# Patient Record
Sex: Female | Born: 1985 | Race: White | Hispanic: No | Marital: Married | Smoking: Never smoker
Health system: Southern US, Community
[De-identification: ages and names within clinical notes are randomized; demographics above are authoritative.]

## PROBLEM LIST (undated history)

## (undated) ENCOUNTER — Inpatient Hospital Stay (HOSPITAL_COMMUNITY): Payer: Self-pay

## (undated) DIAGNOSIS — E079 Disorder of thyroid, unspecified: Secondary | ICD-10-CM

## (undated) DIAGNOSIS — M329 Systemic lupus erythematosus, unspecified: Secondary | ICD-10-CM

## (undated) DIAGNOSIS — IMO0002 Reserved for concepts with insufficient information to code with codable children: Secondary | ICD-10-CM

## (undated) DIAGNOSIS — E039 Hypothyroidism, unspecified: Secondary | ICD-10-CM

## (undated) DIAGNOSIS — E559 Vitamin D deficiency, unspecified: Secondary | ICD-10-CM

## (undated) HISTORY — DX: Reserved for concepts with insufficient information to code with codable children: IMO0002

## (undated) HISTORY — PX: WISDOM TOOTH EXTRACTION: SHX21

## (undated) HISTORY — DX: Vitamin D deficiency, unspecified: E55.9

## (undated) HISTORY — PX: NO PAST SURGERIES: SHX2092

## (undated) HISTORY — DX: Disorder of thyroid, unspecified: E07.9

## (undated) HISTORY — DX: Systemic lupus erythematosus, unspecified: M32.9

---

## 2012-02-01 LAB — HEPATIC FUNCTION PANEL
ALT: 14 U/L (ref 7–35)
ALT: 14 U/L (ref 7–35)
AST: 21 U/L (ref 13–35)
Alkaline Phosphatase: 63 U/L (ref 25–125)
Alkaline Phosphatase: 63 U/L (ref 25–125)
Bilirubin, Total: 0.6 mg/dL

## 2012-02-01 LAB — CBC AND DIFFERENTIAL
Hemoglobin: 13.8 g/dL (ref 12.0–16.0)
WBC: 5.5 10^3/mL

## 2012-02-01 LAB — BASIC METABOLIC PANEL
Glucose: 82 mg/dL
Potassium: 4 mmol/L (ref 3.4–5.3)
Sodium: 136 mmol/L — AB (ref 137–147)

## 2012-02-01 LAB — POCT ERYTHROCYTE SEDIMENTATION RATE, NON-AUTOMATED

## 2012-07-21 LAB — HM PAP SMEAR: HM Pap smear: NEGATIVE

## 2013-07-24 ENCOUNTER — Ambulatory Visit: Payer: Self-pay | Admitting: Family Medicine

## 2013-07-24 ENCOUNTER — Ambulatory Visit (INDEPENDENT_AMBULATORY_CARE_PROVIDER_SITE_OTHER): Payer: 59 | Admitting: Physician Assistant

## 2013-07-24 ENCOUNTER — Encounter: Payer: Self-pay | Admitting: Physician Assistant

## 2013-07-24 VITALS — BP 136/65 | HR 88 | Ht 66.0 in | Wt 163.0 lb

## 2013-07-24 DIAGNOSIS — Z3009 Encounter for other general counseling and advice on contraception: Secondary | ICD-10-CM

## 2013-07-24 DIAGNOSIS — J069 Acute upper respiratory infection, unspecified: Secondary | ICD-10-CM

## 2013-07-24 DIAGNOSIS — M329 Systemic lupus erythematosus, unspecified: Secondary | ICD-10-CM | POA: Insufficient documentation

## 2013-07-24 DIAGNOSIS — Z131 Encounter for screening for diabetes mellitus: Secondary | ICD-10-CM

## 2013-07-24 DIAGNOSIS — Z13220 Encounter for screening for lipoid disorders: Secondary | ICD-10-CM

## 2013-07-24 DIAGNOSIS — E039 Hypothyroidism, unspecified: Secondary | ICD-10-CM

## 2013-07-24 MED ORDER — FLUTICASONE PROPIONATE 50 MCG/ACT NA SUSP: 2.0000 | Freq: Every day | NASAL | Status: DC

## 2013-07-24 NOTE — Progress Notes (Signed)
Subjective:    Patient ID: Melissa Burch, female    DOB: October 13, 1985, 27 y.o.   MRN: [id number]  HPI The patient is a 27 year old female who presents to the clinic to establish care today. Patient has recently moved here from [place] in her husband's job was relocated. She is in need of some referrals in the area as well as management of her hypothyroidism. Patient does have a past medical history for lupus and hypothyroidism. Melissa Burch has been fairly well managed with Plaquenil. Patient currently takes levothyroxine 125 MCG tablets daily for hypothyroidism. Hypothyroidism was diagnosed about a year ago. She does so much better today. She continues to have flares of lupus but they seemed to be managed with pain active in the medication.  Her last Pap smear was 9/13. She would like to be referred to OB/GYN to talk about family planning and 2 get her yearly Pap smear.  She does not smoke and drinks alcohol socially around 2 drinks a week. Patient does stay active with a leg, walking, hiking.  Patient has not had screening labs done in a while. I also do not see an up-to-date tetanus shot.  Family history is positive for heart attack, diabetes, high cholesterol, rheumatoid arthritis, and stroke.   Patient does come in today feeling like she has some swollen glands in her neck. Glands have been swollen for the last 3 days. She denies any sinus pressure, ear pain, cough, sore throat, fever, chills, nausea or vomiting. She does feel like a little thicker to swallow. She has no sick contacts. She's tried nothing to make better and nothing seems to be making worse. She feels her symptoms are stable.   Review of Systems  All other systems reviewed and are negative.       Objective:   Physical Exam  Constitutional: She is oriented to person, place, and time. She appears well-developed and well-nourished.  HENT:  Head: Normocephalic and atraumatic.  Right Ear: External ear normal.  Left Ear:  External ear normal.  Nose: Nose normal.  Mouth/Throat: No oropharyngeal exudate.  TMs clear bilaterally.  Oropharynx erythematous with PND present in back of throat. Tonsils not enlarged and no exudate.   Negative for any maxillary or frontal sinus tenderness.   Eyes: Conjunctivae are normal. Right eye exhibits no discharge. Left eye exhibits no discharge.  Neck: Normal range of motion.  Anterior lymph nodes are swollen but not tender to touch.  Cardiovascular: Normal rate, regular rhythm and normal heart sounds.   Pulmonary/Chest: Effort normal and breath sounds normal. She has no wheezes.  Neurological: She is alert and oriented to person, place, and time.  Skin: Skin is warm and dry.  Psychiatric: She has a normal mood and affect. Her behavior is normal.          Assessment & Plan:    Screening labs were ordered for this patient.  Patient declined flu shot today. Risk and benefits of flu shot were discussed with patient. Patient was also suggested to get a tetanus shot. Patient declines at this time.  LUpus- referred patient how to rheumatologist. [facility] were recommended for eye exam to manage plaquenil and eye changes.   Family planning/need for pap- will refer to OB. This would be an appropriate place to start in wanting to become pregnant in the near future especially with concomitant lupus.  Hypothyroidism- we will recheck TSH today. We'll make adjustments if necessary.  URI- I do not see any infectious causes for  swollen lymph nodes. Did get nasal spray to help with postnasal drip. Gave handout with upper respiratory symptomatic care. Encouraged patient to continue with symptomatic care. Call if steadily worsen or if not improving. Discussed with patient and she had recent flare of lupus could be some inflammatory response.

## 2013-07-24 NOTE — Patient Instructions (Addendum)
[facility] in [place].   Will call with specialist appt.   Upper Respiratory Infection, Adult An upper respiratory infection (URI) is also known as the common cold. It is often caused by a type of germ (virus). Colds are easily spread (contagious). You can pass it to others by kissing, coughing, sneezing, or drinking out of the same glass. Usually, you get better in 1 or 2 weeks.  HOME CARE   Only take medicine as told by your doctor.  Use a warm mist humidifier or breathe in steam from a hot shower.  Drink enough water and fluids to keep your pee (urine) clear or pale yellow.  Get plenty of rest.  Return to work when your temperature is back to normal or as told by your doctor. You may use a face mask and wash your hands to stop your cold from spreading. GET HELP RIGHT AWAY IF:   After the first few days, you feel you are getting worse.  You have questions about your medicine.  You have chills, shortness of breath, or brown or red spit (mucus).  You have yellow or brown snot (nasal discharge) or pain in the face, especially when you bend forward.  You have a fever, puffy (swollen) neck, pain when you swallow, or white spots in the back of your throat.  You have a bad headache, ear pain, sinus pain, or chest pain.  You have a high-pitched whistling sound when you breathe in and out (wheezing).  You have a lasting cough or cough up blood.  You have sore muscles or a stiff neck. MAKE SURE YOU:   Understand these instructions.  Will watch your condition.  Will get help right away if you are not doing well or get worse. Document Released: 02/24/2008 Document Revised: 11/30/2011 Document Reviewed: 01/12/2011 [facility] Patient Information 2014 [place], [place].

## 2013-07-25 ENCOUNTER — Encounter: Payer: Self-pay | Admitting: Physician Assistant

## 2013-07-25 DIAGNOSIS — K589 Irritable bowel syndrome without diarrhea: Secondary | ICD-10-CM | POA: Insufficient documentation

## 2013-07-25 DIAGNOSIS — K219 Gastro-esophageal reflux disease without esophagitis: Secondary | ICD-10-CM | POA: Insufficient documentation

## 2013-07-25 DIAGNOSIS — E7800 Pure hypercholesterolemia, unspecified: Secondary | ICD-10-CM | POA: Insufficient documentation

## 2013-07-27 ENCOUNTER — Other Ambulatory Visit: Payer: Self-pay | Admitting: Physician Assistant

## 2013-07-27 LAB — COMPLETE METABOLIC PANEL WITH GFR
ALT: 15 U/L (ref 0–35)
AST: 20 U/L (ref 0–37)
Alkaline Phosphatase: 70 U/L (ref 39–117)
BUN: 12 mg/dL (ref 6–23)
Calcium: 9.3 mg/dL (ref 8.4–10.5)
Chloride: 102 mEq/L (ref 96–112)
Creat: 0.82 mg/dL (ref 0.50–1.10)
Total Bilirubin: 0.8 mg/dL (ref 0.3–1.2)

## 2013-07-27 LAB — LIPID PANEL
Cholesterol: 153 mg/dL (ref 0–200)
HDL: 70 mg/dL (ref >39)
LDL Cholesterol: 69 mg/dL (ref 0–99)
Total CHOL/HDL Ratio: 2.2 Ratio
Triglycerides: 69 mg/dL (ref <150)
VLDL: 14 mg/dL (ref 0–40)

## 2013-07-27 LAB — T4, FREE: Free T4: 1.36 ng/dL (ref 0.80–1.80)

## 2013-08-14 ENCOUNTER — Encounter: Payer: 59 | Admitting: Advanced Practice Midwife

## 2013-08-14 DIAGNOSIS — Z124 Encounter for screening for malignant neoplasm of cervix: Secondary | ICD-10-CM

## 2013-08-14 DIAGNOSIS — Z01419 Encounter for gynecological examination (general) (routine) without abnormal findings: Secondary | ICD-10-CM

## 2013-08-28 ENCOUNTER — Encounter: Payer: Self-pay | Admitting: Physician Assistant

## 2013-08-28 ENCOUNTER — Other Ambulatory Visit: Payer: Self-pay | Admitting: Physician Assistant

## 2013-08-28 MED ORDER — LEVOTHYROXINE SODIUM 125 MCG PO TABS: 125.0000 ug | ORAL_TABLET | Freq: Every day | ORAL | Status: DC

## 2013-09-15 ENCOUNTER — Encounter: Payer: Self-pay | Admitting: *Deleted

## 2013-10-16 ENCOUNTER — Encounter: Payer: Self-pay | Admitting: Advanced Practice Midwife

## 2013-10-16 ENCOUNTER — Ambulatory Visit (INDEPENDENT_AMBULATORY_CARE_PROVIDER_SITE_OTHER): Payer: 59 | Admitting: Advanced Practice Midwife

## 2013-10-16 VITALS — BP 105/63 | HR 84 | Resp 16 | Ht 66.0 in | Wt 166.0 lb

## 2013-10-16 DIAGNOSIS — Z3009 Encounter for other general counseling and advice on contraception: Secondary | ICD-10-CM

## 2013-10-16 DIAGNOSIS — Z124 Encounter for screening for malignant neoplasm of cervix: Secondary | ICD-10-CM

## 2013-10-16 DIAGNOSIS — Z01419 Encounter for gynecological examination (general) (routine) without abnormal findings: Secondary | ICD-10-CM

## 2013-10-16 DIAGNOSIS — E7800 Pure hypercholesterolemia, unspecified: Secondary | ICD-10-CM

## 2013-10-16 MED ORDER — NORETHINDRON-ETHINYL ESTRAD-FE 1-20/1-30/1-35 MG-MCG PO TABS: 1.0000 | ORAL_TABLET | Freq: Every day | ORAL | Status: DC

## 2013-10-16 NOTE — Progress Notes (Signed)
  Subjective:     Melissa HatchetMaggie Burch is a 28 y.o. female here for a routine exam.  Current complaints: None.  May want to get pregnant this year, but wants Rx for OCPs anyway. States PMS more intense over past few months.  Personal health questionnaire reviewed: not asked.   Gynecologic History Patient's last menstrual period was 10/17/2012. Contraception: OCP (estrogen/progesterone) Last Pap: 2 yrs. Results were: normal Last mammogram: never.   Obstetric History OB History  Gravida Para Term Preterm AB SAB TAB Ectopic Multiple Living  0 0 0 0 0 0 0 0 0 0          The following portions of the patient's history were reviewed and updated as appropriate: allergies, current medications, past family history, past medical history, past social history, past surgical history and problem list.  Review of Systems Pertinent items are noted in HPI.    Objective:    BP 105/63  Pulse 84  Resp 16  Ht 5\' 6"  (1.676 m)  Wt 75.297 kg (166 lb)  BMI 26.81 kg/m2  LMP 10/17/2012 General appearance: alert, cooperative and no distress Neck: no adenopathy, no carotid bruit, no JVD, supple, symmetrical, trachea midline and thyroid not enlarged, symmetric, no tenderness/mass/nodules Back: symmetric, no curvature. ROM normal. No CVA tenderness. Lungs: clear to auscultation bilaterally Breasts: normal appearance, no masses or tenderness, No nipple retraction or dimpling, No nipple discharge or bleeding, No axillary or supraclavicular adenopathy Heart: regular rate and rhythm, S1, S2 normal, no murmur, click, rub or gallop Abdomen: soft, non-tender; bowel sounds normal; no masses,  no organomegaly Pelvic: cervix normal in appearance, external genitalia normal, no adnexal masses or tenderness, no cervical motion tenderness, rectovaginal septum normal, uterus normal size, shape, and consistency and vagina normal without discharge    Assessment:    Healthy female exam.   Lupus  PMS May want to conceive  soon  Plan:    Education reviewed: self breast exams and Start prenatal vitamins at least 2 mos prior to conception. Contraception: OCP (estrogen/progesterone). Follow up in: 1 year.   Also discussed impact of Lupus on pregnancy, recommendations of what we would do and how it would affect her care.  Encouraged early [facility]

## 2013-10-16 NOTE — Patient Instructions (Signed)

## 2013-10-23 ENCOUNTER — Encounter: Payer: Self-pay | Admitting: *Deleted

## 2014-03-27 ENCOUNTER — Other Ambulatory Visit: Payer: Self-pay | Admitting: Physician Assistant

## 2014-04-18 ENCOUNTER — Ambulatory Visit (INDEPENDENT_AMBULATORY_CARE_PROVIDER_SITE_OTHER): Payer: 59 | Admitting: Family Medicine

## 2014-04-18 ENCOUNTER — Encounter: Payer: Self-pay | Admitting: Family Medicine

## 2014-04-18 VITALS — BP 113/76 | HR 78 | Temp 98.1°F | Wt 159.0 lb

## 2014-04-18 DIAGNOSIS — R058 Other specified cough: Secondary | ICD-10-CM

## 2014-04-18 DIAGNOSIS — R05 Cough: Secondary | ICD-10-CM

## 2014-04-18 DIAGNOSIS — R059 Cough, unspecified: Secondary | ICD-10-CM

## 2014-04-18 MED ORDER — HYDROCODONE-HOMATROPINE 5-1.5 MG/5ML PO SYRP: 5.0000 mL | ORAL_SOLUTION | Freq: Three times a day (TID) | ORAL | Status: DC | PRN

## 2014-04-18 MED ORDER — PREDNISONE 20 MG PO TABS: ORAL_TABLET | ORAL | Status: AC

## 2014-04-18 NOTE — Progress Notes (Signed)
CC: Melissa Burch is a 28 y.o. female is here for cough since sunday   Subjective: HPI:  Complains of nonproductive cough that has been present since Sunday morning that slowly became severe in severity peaking on Tuesday. Last night symptoms were bad enough to where she did not get any sleep at all is having constant coughing with a burning sensation in her chest. Symptoms not improved with Aleve nor over-the-counter cough syrups. Symptoms have slightly improved since resting this morning. Denies wheezing, shortness of breath, exertional chest pain, blood in sputum, productive cough, confusion, nasal congestion, sinus pressure. Review of systems is positive for myalgias that were present at the onset of the illness but now resolved, positive for fatigue.   Review Of Systems Outlined In HPI  Past Medical History  Diagnosis Date  . Lupus   . Thyroid disease     No past surgical history on file. Family History  Problem Relation Age of Onset  . Hyperlipidemia Mother   . Diabetes Father   . Heart attack Paternal Grandmother   . Stroke Paternal Grandmother   . Diabetes Paternal Grandfather     History   Social History  . Marital Status: Married    Spouse Name: N/A    Number of Children: N/A  . Years of Education: N/A   Occupational History  . Nanny    Social History Main Topics  . Smoking status: Never Smoker   . Smokeless tobacco: Never Used  . Alcohol Use: Yes  . Drug Use: No  . Sexual Activity: Yes    Partners: Male    Birth Control/ Protection: Pill   Other Topics Concern  . Not on file   Social History Narrative  . No narrative on file     Objective: BP 113/76  Pulse 78  Temp(Src) 98.1 F (36.7 C) (Oral)  Wt 159 lb (72.122 kg)  SpO2 100%  General: Alert and Oriented, No Acute Distress HEENT: Pupils equal, round, reactive to light. Conjunctivae clear.  External ears unremarkable, canals clear with intact TMs with appropriate landmarks.  Middle ear appears  open without effusion. Pink inferior turbinates.  Moist mucous membranes, pharynx without inflammation nor lesions.  Neck supple without palpable lymphadenopathy nor abnormal masses. Lungs: Clear to auscultation bilaterally, no wheezing/ronchi/rales.  Comfortable work of breathing. Good air movement. Extremities: No peripheral edema.  Strong peripheral pulses.  Mental Status: No depression, anxiety, nor agitation. Skin: Warm and dry.  Assessment & Plan: Seward GraterMaggie was seen today for cough since sunday.  Diagnoses and associated orders for this visit:  Post-viral cough syndrome - HYDROcodone-homatropine (HYCODAN) 5-1.5 MG/5ML syrup; Take 5 mLs by mouth every 8 (eight) hours as needed for cough. - predniSONE (DELTASONE) 20 MG tablet; Three tabs daily days 1-3, two tabs daily days 4-6, one tab daily days 7-9, half tab daily days 10-13.    Cough: Discussed this is most likely viral in etiology symptomatically control with starting Hycodan as needed, offered prednisone taper to help with chest discomfort and speed up resolution however she politely declines and would like to see how well the Hycodan works first.Signs and symptoms requring emergent/urgent reevaluation were discussed with the patient.   No Follow-up on file.

## 2014-04-23 ENCOUNTER — Other Ambulatory Visit: Payer: Self-pay | Admitting: Physician Assistant

## 2014-05-14 ENCOUNTER — Ambulatory Visit (INDEPENDENT_AMBULATORY_CARE_PROVIDER_SITE_OTHER): Payer: 59 | Admitting: Physician Assistant

## 2014-05-14 ENCOUNTER — Encounter: Payer: Self-pay | Admitting: Physician Assistant

## 2014-05-14 VITALS — BP 119/65 | HR 81 | Ht 66.0 in | Wt 176.0 lb

## 2014-05-14 DIAGNOSIS — E039 Hypothyroidism, unspecified: Secondary | ICD-10-CM

## 2014-05-14 MED ORDER — LEVOTHYROXINE SODIUM 125 MCG PO TABS: ORAL_TABLET | ORAL | Status: DC

## 2014-05-14 NOTE — Progress Notes (Signed)
   Subjective:    Patient ID: Melissa Burch, female    DOB: 02-25-1986, 28 y.o.   MRN: [id number]  HPI Pt presents to the clinic for 6 month medication follow up.   Hypothyroidism- doing well on current dose. She denies any mood changes or loss of energy.She has gained 17 lbs in one month. Pt feels like it is prednisone and stress combination. She lost her job unexpectantly last month. She is not exercising.    Review of Systems  All other systems reviewed and are negative.      Objective:   Physical Exam  Constitutional: She is oriented to person, place, and time. She appears well-developed and well-nourished.  HENT:  Head: Normocephalic and atraumatic.  Neck: Normal range of motion. Neck supple. No thyromegaly present.  Cardiovascular: Normal rate, regular rhythm and normal heart sounds.   Pulmonary/Chest: Effort normal and breath sounds normal.  Neurological: She is alert and oriented to person, place, and time.  Skin: Skin is dry.  Psychiatric: She has a normal mood and affect. Her behavior is normal.          Assessment & Plan:  Hypothyroidism- will check labs today. Refilled levothyroxine for 6 months. Discussed weight gain and importance of regular exercise.

## 2014-05-15 ENCOUNTER — Encounter: Payer: Self-pay | Admitting: Physician Assistant

## 2014-05-15 LAB — TSH: TSH: 2.003 u[IU]/mL (ref 0.350–4.500)

## 2014-05-15 LAB — T4, FREE: FREE T4: 1.44 ng/dL (ref 0.80–1.80)

## 2014-05-21 ENCOUNTER — Encounter: Payer: Self-pay | Admitting: Advanced Practice Midwife

## 2014-09-21 NOTE — L&D Delivery Note (Signed)
Operative Delivery Note    I was called to room at 0539 for 3 contractions with deep deceleration to 70s after with good recovery The station at that point was +2 The decelerations were relatively infrequent at this point with excellent variability I stayed in the room as she continued pushing.  Her effort was good but the uterus was lacking power I started pitocin to improve uterine power At about 0625 the decelerations returned with sluggish return and then no complete return. The station was now +4-5 I discussed placement of the vacuum with the patient due to this and she agreed i placed the vacuum at 0634 It was used for 3 contractions A MLE was cut  At 6:40 AM a viable female was delivered via .  Presentation: vertex; Position: Occiput,, Anterior; Station: +5.  Verbal consent: obtained from patient.  Risks and benefits discussed in detail.  Risks include, but are not limited to the risks of anesthesia, bleeding, infection, damage to maternal tissues, fetal cephalhematoma.  There is also the risk of inability to effect vaginal delivery of the head, or shoulder dystocia that cannot be resolved by established maneuvers, leading to the need for emergency cesarean section.  APGAR: 6, 9; weight  .   Placenta status: delivered intact, .   Cord: 3 vessel  with the following complications: thin cord.  Cord pH: 7.06  Anesthesia: Epidural  Instruments: Bell style vacuum Episiotomy: Median Lacerations: 3rd degree Suture Repair: 3.0 partial,capsule was breached but the muscle itself was intact Est. Blood Loss (mL):  250  Mom to postpartum.  Baby to Couplet care / Skin to Skin.  Lazaro ArmsEURE,Kemontae Dunklee H, MD  09/19/2015, 7:08 AM

## 2014-12-12 ENCOUNTER — Other Ambulatory Visit: Payer: Self-pay | Admitting: Physician Assistant

## 2014-12-12 ENCOUNTER — Ambulatory Visit (INDEPENDENT_AMBULATORY_CARE_PROVIDER_SITE_OTHER): Payer: 59 | Admitting: Obstetrics & Gynecology

## 2014-12-12 ENCOUNTER — Encounter: Payer: Self-pay | Admitting: Obstetrics & Gynecology

## 2014-12-12 VITALS — BP 116/63 | HR 92 | Resp 16 | Ht 66.0 in | Wt 180.0 lb

## 2014-12-12 DIAGNOSIS — Z124 Encounter for screening for malignant neoplasm of cervix: Secondary | ICD-10-CM

## 2014-12-12 DIAGNOSIS — Z01419 Encounter for gynecological examination (general) (routine) without abnormal findings: Secondary | ICD-10-CM

## 2014-12-12 DIAGNOSIS — Z0000 Encounter for general adult medical examination without abnormal findings: Secondary | ICD-10-CM

## 2014-12-12 NOTE — Progress Notes (Signed)
Subjective:    Melissa Burch is a 29 y.o. MW P0  female who presents for an annual exam. The patient has no complaints today. She is having unprotected IC for the last 5 months, would like a pregnancy.The patient is sexually active. GYN screening history: last pap: was normal. The patient wears seatbelts: yes. The patient participates in regular exercise: yes. (ballet) Has the patient ever been transfused or tattooed?: yes. The patient reports that there is not domestic violence in her life.   Menstrual History: OB History    Gravida Para Term Preterm AB TAB SAB Ectopic Multiple Living   0 0 0 0 0 0 0 0 0 0       Menarche age: [age]  Patient's last menstrual period was 11/22/2014.    The following portions of the patient's history were reviewed and updated as appropriate: allergies, current medications, past family history, past medical history, past social history, past surgical history and problem list.  Review of Systems Pertinent items are noted in HPI. Married for 7 years, denies dyspareunia. 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 Declines a flu vaccine.   Objective:    BP 116/63 mmHg  Pulse 92  Resp 16  Ht 5\' 6"  (1.676 m)  Wt 180 lb (81.647 kg)  BMI 29.07 kg/m2  LMP 11/22/2014  General Appearance:    Alert, cooperative, no distress, appears stated age  Head:    Normocephalic, without obvious abnormality, atraumatic  Eyes:    PERRL, conjunctiva/corneas clear, EOM's intact, fundi    benign, both eyes  Ears:    Normal TM's and external ear canals, both ears  Nose:   Nares normal, septum midline, mucosa normal, no drainage    or sinus tenderness  Throat:   Lips, mucosa, and tongue normal; teeth and gums normal  Neck:   Supple, symmetrical, trachea midline, no adenopathy;    thyroid:  no enlargement/tenderness/nodules; no carotid   bruit or JVD  Back:     Symmetric, no curvature, ROM normal, no CVA tenderness  Lungs:     Clear to auscultation bilaterally, respirations unlabored  Chest  Wall:    No tenderness or deformity   Heart:    Regular rate and rhythm, S1 and S2 normal, no murmur, rub   or gallop  Breast Exam:    No tenderness, masses, or nipple abnormality  Abdomen:     Soft, non-tender, bowel sounds active all four quadrants,    no masses, no organomegaly  Genitalia:    Normal female without lesion, discharge or tenderness, shaved, NSSA, NT, mobile, normal adnexal exam     Extremities:   Extremities normal, atraumatic, no cyanosis or edema  Pulses:   2+ and symmetric all extremities  Skin:   Skin color, texture, turgor normal, no rashes or lesions  Lymph nodes:   Cervical, supraclavicular, and axillary nodes normal  Neurologic:   CNII-XII intact, normal strength, sensation and reflexes    throughout  .    Assessment:    Healthy female exam.    Plan:     Breast self exam technique reviewed and patient encouraged to perform self-exam monthly. Thin prep Pap smear.   Rec MVI daily

## 2014-12-13 LAB — CYTOLOGY - PAP

## 2015-02-04 ENCOUNTER — Encounter (HOSPITAL_COMMUNITY): Payer: Self-pay

## 2015-02-04 ENCOUNTER — Inpatient Hospital Stay (HOSPITAL_COMMUNITY)
Admission: AD | Admit: 2015-02-04 | Discharge: 2015-02-04 | Disposition: A | Discharge status: Home / Self Care | Payer: 59 | Source: Ambulatory Visit | Attending: Family Medicine | Admitting: Family Medicine

## 2015-02-04 ENCOUNTER — Inpatient Hospital Stay (HOSPITAL_COMMUNITY): Payer: 59

## 2015-02-04 DIAGNOSIS — O208 Other hemorrhage in early pregnancy: Secondary | ICD-10-CM | POA: Diagnosis not present

## 2015-02-04 DIAGNOSIS — O209 Hemorrhage in early pregnancy, unspecified: Secondary | ICD-10-CM

## 2015-02-04 DIAGNOSIS — O4691 Antepartum hemorrhage, unspecified, first trimester: Secondary | ICD-10-CM | POA: Diagnosis not present

## 2015-02-04 DIAGNOSIS — Z3A01 Less than 8 weeks gestation of pregnancy: Secondary | ICD-10-CM | POA: Diagnosis not present

## 2015-02-04 LAB — CBC WITH DIFFERENTIAL/PLATELET
Basophils Absolute: 0 K/uL (ref 0.0–0.1)
Basophils Relative: 0 % (ref 0–1)
Eosinophils Absolute: 0.1 K/uL (ref 0.0–0.7)
Eosinophils Relative: 1 % (ref 0–5)
HCT: 34.4 % — ABNORMAL LOW (ref 36.0–46.0)
Hemoglobin: 11.8 g/dL — ABNORMAL LOW (ref 12.0–15.0)
Lymphocytes Relative: 23 % (ref 12–46)
Lymphs Abs: 2 K/uL (ref 0.7–4.0)
MCH: 30.9 pg (ref 26.0–34.0)
MCHC: 34.3 g/dL (ref 30.0–36.0)
MCV: 90.1 fL (ref 78.0–100.0)
Monocytes Absolute: 0.6 K/uL (ref 0.1–1.0)
Monocytes Relative: 7 % (ref 3–12)
Neutro Abs: 6 K/uL (ref 1.7–7.7)
Neutrophils Relative %: 69 % (ref 43–77)
Platelets: 256 K/uL (ref 150–400)
RBC: 3.82 MIL/uL — ABNORMAL LOW (ref 3.87–5.11)
RDW: 12.3 % (ref 11.5–15.5)
WBC: 8.8 K/uL (ref 4.0–10.5)

## 2015-02-04 LAB — URINE MICROSCOPIC-ADD ON

## 2015-02-04 LAB — URINALYSIS, ROUTINE W REFLEX MICROSCOPIC
Bilirubin Urine: NEGATIVE
Glucose, UA: NEGATIVE mg/dL
Ketones, ur: 15 mg/dL — AB
Leukocytes, UA: NEGATIVE
Nitrite: NEGATIVE
PH: 6 (ref 5.0–8.0)
Protein, ur: NEGATIVE mg/dL
UROBILINOGEN UA: 0.2 mg/dL (ref 0.0–1.0)

## 2015-02-04 LAB — POCT PREGNANCY, URINE: PREG TEST UR: POSITIVE — AB

## 2015-02-04 NOTE — Discharge Instructions (Signed)

## 2015-02-04 NOTE — MAU Note (Signed)
Abdominal cramping & vaginal bleeding since this afternoon. Red blood dripping in toilet when she went to the bathroom this evening.

## 2015-02-04 NOTE — MAU Provider Note (Signed)
  History    G1 @7 .2 wks in with vag bleeding that started at 1800 tonight. Mild abd cramping. CSN: [id number]  Arrival date and time: 02/04/15 1929   None     Chief Complaint  Patient presents with  . Possible Pregnancy  . Abdominal Cramping  . Vaginal Bleeding   HPI  OB History    Gravida Para Term Preterm AB TAB SAB Ectopic Multiple Living   1 0 0 0 0 0 0 0 0 0       Past Medical History  Diagnosis Date  . Lupus   . Thyroid disease   . Vitamin D deficiency     No past surgical history on file.  Family History  Problem Relation Age of Onset  . Hyperlipidemia Mother   . Diabetes Father   . Heart attack Paternal Grandmother   . Stroke Paternal Grandmother   . Diabetes Paternal Grandfather     History  Substance Use Topics  . Smoking status: Never Smoker   . Smokeless tobacco: Never Used  . Alcohol Use: Yes    Allergies: No Known Allergies  Prescriptions prior to admission  Medication Sig Dispense Refill Last Dose  . cholecalciferol (VITAMIN D) 1000 UNITS tablet Take 1,000 Units by mouth daily.   02/04/2015 at Unknown time  . Ginger 500 MG CAPS Take 1 capsule by mouth as needed (nausea).   Past Week at Unknown time  . hydroxychloroquine (PLAQUENIL) 200 MG tablet Take 200 mg by mouth 2 (two) times daily.   02/04/2015 at Unknown time  . levothyroxine (SYNTHROID, LEVOTHROID) 125 MCG tablet take 1 tablet by mouth once daily 30 tablet 2 02/04/2015 at Unknown time  . Prenatal Vit-Fe Fumarate-FA (PRENATAL MULTIVITAMIN) TABS tablet Take 1 tablet by mouth daily at 12 noon.   02/03/2015 at Unknown time  . Probiotic Product (PROBIOTIC DAILY PO) Take 1 capsule by mouth daily.    02/04/2015 at Unknown time    Review of Systems  Constitutional: Negative.   HENT: Negative.   Respiratory: Negative.   Cardiovascular: Negative.   Gastrointestinal: Positive for abdominal pain.  Genitourinary: Negative.   Musculoskeletal: Negative.   Skin: Negative.   Neurological: Negative.    Endo/Heme/Allergies: Negative.   Psychiatric/Behavioral: Negative.    Physical Exam   Blood pressure 125/56, pulse 75, temperature 99.2 F (37.3 C), temperature source Oral, resp. rate 16, height 5\' 6"  (1.676 m), weight 175 lb (79.379 kg), last menstrual period 12/18/2014, SpO2 100 %.  Physical Exam  Constitutional: She is oriented to person, place, and time. She appears well-developed and well-nourished.  HENT:  Head: Normocephalic.  Eyes: Pupils are equal, round, and reactive to light.  Neck: Normal range of motion.  Cardiovascular: Normal rate, regular rhythm, normal heart sounds and intact distal pulses.   Respiratory: Effort normal and breath sounds normal.  GI: Soft. Bowel sounds are normal.  Genitourinary: Vagina normal and uterus normal.  Sterile spec exam sm amt dark vag bleeding, cervix visually closed.  Musculoskeletal: Normal range of motion.  Neurological: She is alert and oriented to person, place, and time. She has normal reflexes.  Skin: Skin is warm and dry.  Psychiatric: She has a normal mood and affect. Her behavior is normal. Judgment and thought content normal.    MAU Course  Procedures  MDM subchrionic bleed  Assessment and Plan  Viable iup at 7.2 wks, subchorionic bleed.  Melissa Burch, Melissa Burch 02/04/2015, 9:36 PM

## 2015-02-05 LAB — ABO/RH: ABO/RH(D): O POS

## 2015-02-11 ENCOUNTER — Ambulatory Visit (INDEPENDENT_AMBULATORY_CARE_PROVIDER_SITE_OTHER): Payer: 59 | Admitting: Advanced Practice Midwife

## 2015-02-11 ENCOUNTER — Encounter: Payer: Self-pay | Admitting: Advanced Practice Midwife

## 2015-02-11 VITALS — BP 122/76 | HR 88 | Wt 176.0 lb

## 2015-02-11 DIAGNOSIS — E039 Hypothyroidism, unspecified: Secondary | ICD-10-CM | POA: Diagnosis not present

## 2015-02-11 DIAGNOSIS — Z3491 Encounter for supervision of normal pregnancy, unspecified, first trimester: Secondary | ICD-10-CM | POA: Diagnosis not present

## 2015-02-11 DIAGNOSIS — D6862 Lupus anticoagulant syndrome: Secondary | ICD-10-CM

## 2015-02-11 DIAGNOSIS — O99111 Other diseases of the blood and blood-forming organs and certain disorders involving the immune mechanism complicating pregnancy, first trimester: Secondary | ICD-10-CM | POA: Diagnosis not present

## 2015-02-11 NOTE — Progress Notes (Signed)
Bedside ultrasound CRL 16.4 mm gestational age [redacted] weeks.

## 2015-02-12 LAB — OBSTETRIC PANEL
Antibody Screen: NEGATIVE
BASOS ABS: 0 10*3/uL (ref 0.0–0.1)
Basophils Relative: 0 % (ref 0–1)
EOS ABS: 0.1 10*3/uL (ref 0.0–0.7)
EOS PCT: 1 % (ref 0–5)
HCT: 38.8 % (ref 36.0–46.0)
HEMOGLOBIN: 13 g/dL (ref 12.0–15.0)
Hepatitis B Surface Ag: NEGATIVE
LYMPHS PCT: 26 % (ref 12–46)
Lymphs Abs: 1.7 10*3/uL (ref 0.7–4.0)
MCH: 30.9 pg (ref 26.0–34.0)
MCHC: 33.5 g/dL (ref 30.0–36.0)
MCV: 92.2 fL (ref 78.0–100.0)
MONOS PCT: 11 % (ref 3–12)
MPV: 9.5 fL (ref 8.6–12.4)
Monocytes Absolute: 0.7 10*3/uL (ref 0.1–1.0)
NEUTROS PCT: 62 % (ref 43–77)
Neutro Abs: 4 10*3/uL (ref 1.7–7.7)
Platelets: 270 10*3/uL (ref 150–400)
RBC: 4.21 MIL/uL (ref 3.87–5.11)
RDW: 13 % (ref 11.5–15.5)
Rh Type: POSITIVE
Rubella: 5.52 Index — ABNORMAL HIGH (ref <0.90)
WBC: 6.5 10*3/uL (ref 4.0–10.5)

## 2015-02-12 LAB — COMPREHENSIVE METABOLIC PANEL
ALT: 18 U/L (ref 0–35)
AST: 21 U/L (ref 0–37)
Albumin: 3.7 g/dL (ref 3.5–5.2)
Alkaline Phosphatase: 67 U/L (ref 39–117)
BUN: 9 mg/dL (ref 6–23)
CO2: 20 meq/L (ref 19–32)
Calcium: 9.1 mg/dL (ref 8.4–10.5)
Chloride: 106 mEq/L (ref 96–112)
Creat: 0.61 mg/dL (ref 0.50–1.10)
GLUCOSE: 89 mg/dL (ref 70–99)
Potassium: 3.7 mEq/L (ref 3.5–5.3)
Sodium: 134 mEq/L — ABNORMAL LOW (ref 135–145)
Total Bilirubin: 0.8 mg/dL (ref 0.2–1.2)
Total Protein: 6.8 g/dL (ref 6.0–8.3)

## 2015-02-12 LAB — TSH: TSH: 7.515 u[IU]/mL — ABNORMAL HIGH (ref 0.350–4.500)

## 2015-02-12 LAB — HIV ANTIBODY (ROUTINE TESTING W REFLEX): HIV: NONREACTIVE

## 2015-02-12 LAB — GC/CHLAMYDIA PROBE AMP
CT Probe RNA: NEGATIVE
GC PROBE AMP APTIMA: NEGATIVE

## 2015-02-12 LAB — CYSTIC FIBROSIS DIAGNOSTIC STUDY

## 2015-02-13 ENCOUNTER — Encounter: Payer: Self-pay | Admitting: Advanced Practice Midwife

## 2015-02-13 NOTE — Patient Instructions (Signed)
First Trimester of Pregnancy The first trimester of pregnancy is from week 1 until the end of week 12 (months 1 through 3). A week after a sperm fertilizes an egg, the egg will implant on the wall of the uterus. This embryo will begin to develop into a baby. Genes from you and your partner are forming the baby. The female genes determine whether the baby is a boy or a girl. At 6-8 weeks, the eyes and face are formed, and the heartbeat can be seen on ultrasound. At the end of 12 weeks, all the baby's organs are formed.  Now that you are pregnant, you will want to do everything you can to have a healthy baby. Two of the most important things are to get good prenatal care and to follow your health care provider's instructions. Prenatal care is all the medical care you receive before the baby's birth. This care will help prevent, find, and treat any problems during the pregnancy and childbirth. BODY CHANGES Your body goes through many changes during pregnancy. The changes vary from woman to woman.   You may gain or lose a couple of pounds at first.  You may feel sick to your stomach (nauseous) and throw up (vomit). If the vomiting is uncontrollable, call your health care provider.  You may tire easily.  You may develop headaches that can be relieved by medicines approved by your health care provider.  You may urinate more often. Painful urination may mean you have a bladder infection.  You may develop heartburn as a result of your pregnancy.  You may develop constipation because certain hormones are causing the muscles that push waste through your intestines to slow down.  You may develop hemorrhoids or swollen, bulging veins (varicose veins).  Your breasts may begin to grow larger and become tender. Your nipples may stick out more, and the tissue that surrounds them (areola) may become darker.  Your gums may bleed and may be sensitive to brushing and flossing.  Dark spots or blotches (chloasma,  mask of pregnancy) may develop on your face. This will likely fade after the baby is born.  Your menstrual periods will stop.  You may have a loss of appetite.  You may develop cravings for certain kinds of food.  You may have changes in your emotions from day to day, such as being excited to be pregnant or being concerned that something may go wrong with the pregnancy and baby.  You may have more vivid and strange dreams.  You may have changes in your hair. These can include thickening of your hair, rapid growth, and changes in texture. Some women also have hair loss during or after pregnancy, or hair that feels dry or thin. Your hair will most likely return to normal after your baby is born. WHAT TO EXPECT AT YOUR PRENATAL VISITS During a routine prenatal visit:  You will be weighed to make sure you and the baby are growing normally.  Your blood pressure will be taken.  Your abdomen will be measured to track your baby's growth.  The fetal heartbeat will be listened to starting around week 10 or 12 of your pregnancy.  Test results from any previous visits will be discussed. Your health care provider may ask you:  How you are feeling.  If you are feeling the baby move.  If you have had any abnormal symptoms, such as leaking fluid, bleeding, severe headaches, or abdominal cramping.  If you have any questions. Other tests   that may be performed during your first trimester include:  Blood tests to find your blood type and to check for the presence of any previous infections. They will also be used to check for low iron levels (anemia) and Rh antibodies. Later in the pregnancy, blood tests for diabetes will be done along with other tests if problems develop.  Urine tests to check for infections, diabetes, or protein in the urine.  An ultrasound to confirm the proper growth and development of the baby.  An amniocentesis to check for possible genetic problems.  Fetal screens for  spina bifida and Down syndrome.  You may need other tests to make sure you and the baby are doing well. HOME CARE INSTRUCTIONS  Medicines  Follow your health care provider's instructions regarding medicine use. Specific medicines may be either safe or unsafe to take during pregnancy.  Take your prenatal vitamins as directed.  If you develop constipation, try taking a stool softener if your health care provider approves. Diet  Eat regular, well-balanced meals. Choose a variety of foods, such as meat or vegetable-based protein, fish, milk and low-fat dairy products, vegetables, fruits, and whole grain breads and cereals. Your health care provider will help you determine the amount of weight gain that is right for you.  Avoid raw meat and uncooked cheese. These carry germs that can cause birth defects in the baby.  Eating four or five small meals rather than three large meals a day may help relieve nausea and vomiting. If you start to feel nauseous, eating a few soda crackers can be helpful. Drinking liquids between meals instead of during meals also seems to help nausea and vomiting.  If you develop constipation, eat more high-fiber foods, such as fresh vegetables or fruit and whole grains. Drink enough fluids to keep your urine clear or pale yellow. Activity and Exercise  Exercise only as directed by your health care provider. Exercising will help you:  Control your weight.  Stay in shape.  Be prepared for labor and delivery.  Experiencing pain or cramping in the lower abdomen or low back is a good sign that you should stop exercising. Check with your health care provider before continuing normal exercises.  Try to avoid standing for long periods of time. Move your legs often if you must stand in one place for a long time.  Avoid heavy lifting.  Wear low-heeled shoes, and practice good posture.  You may continue to have sex unless your health care provider directs you  otherwise. Relief of Pain or Discomfort  Wear a good support bra for breast tenderness.   Take warm sitz baths to soothe any pain or discomfort caused by hemorrhoids. Use hemorrhoid cream if your health care provider approves.   Rest with your legs elevated if you have leg cramps or low back pain.  If you develop varicose veins in your legs, wear support hose. Elevate your feet for 15 minutes, 3-4 times a day. Limit salt in your diet. Prenatal Care  Schedule your prenatal visits by the twelfth week of pregnancy. They are usually scheduled monthly at first, then more often in the last 2 months before delivery.  Write down your questions. Take them to your prenatal visits.  Keep all your prenatal visits as directed by your health care provider. Safety  Wear your seat belt at all times when driving.  Make a list of emergency phone numbers, including numbers for family, friends, the hospital, and police and fire departments. General Tips    Ask your health care provider for a referral to a local prenatal education class. Begin classes no later than at the beginning of month 6 of your pregnancy.  Ask for help if you have counseling or nutritional needs during pregnancy. Your health care provider can offer advice or refer you to specialists for help with various needs.  Do not use hot tubs, steam rooms, or saunas.  Do not douche or use tampons or scented sanitary pads.  Do not cross your legs for long periods of time.  Avoid cat litter boxes and soil used by cats. These carry germs that can cause birth defects in the baby and possibly loss of the fetus by miscarriage or stillbirth.  Avoid all smoking, herbs, alcohol, and medicines not prescribed by your health care provider. Chemicals in these affect the formation and growth of the baby.  Schedule a dentist appointment. At home, brush your teeth with a soft toothbrush and be gentle when you floss. SEEK MEDICAL CARE IF:   You have  dizziness.  You have mild pelvic cramps, pelvic pressure, or nagging pain in the abdominal area.  You have persistent nausea, vomiting, or diarrhea.  You have a bad smelling vaginal discharge.  You have pain with urination.  You notice increased swelling in your face, hands, legs, or ankles. SEEK IMMEDIATE MEDICAL CARE IF:   You have a fever.  You are leaking fluid from your vagina.  You have spotting or bleeding from your vagina.  You have severe abdominal cramping or pain.  You have rapid weight gain or loss.  You vomit blood or material that looks like coffee grounds.  You are exposed to German measles and have never had them.  You are exposed to fifth disease or chickenpox.  You develop a severe headache.  You have shortness of breath.  You have any kind of trauma, such as from a fall or a car accident. Document Released: 09/01/2001 Document Revised: 01/22/2014 Document Reviewed: 07/18/2013 ExitCare Patient Information 2015 [employer]. This information is not intended to replace advice given to you by your health care provider. Make sure you discuss any questions you have with your health care provider.  

## 2015-02-13 NOTE — Progress Notes (Signed)
   Subjective:    Melissa Burch is a G1P0000 2327w1d being seen today for her first obstetrical visit.  Her obstetrical history is significant for Hypothyroidism, Lupus and infertility. Patient does intend to breast feed. Pregnancy history fully reviewed.  Patient reports no complaints.  States does not have any major organ dysfunction with her lupus. Is on Synthroid 125mcg  Filed Vitals:   02/11/15 0823  BP: 122/76  Pulse: 88  Weight: 176 lb (79.833 kg)    HISTORY: OB History  Gravida Para Term Preterm AB SAB TAB Ectopic Multiple Living  1 0 0 0 0 0 0 0 0 0     # Outcome Date GA Lbr Len/2nd Weight Sex Delivery Anes PTL Lv  1 Current              Past Medical History  Diagnosis Date  . Lupus   . Thyroid disease   . Vitamin D deficiency    Past Surgical History  Procedure Laterality Date  . Wisdom tooth extraction     Family History  Problem Relation Age of Onset  . Hyperlipidemia Mother   . Diabetes Father   . Heart attack Paternal Grandmother   . Stroke Paternal Grandmother   . Diabetes Paternal Grandfather      Exam    Uterus:     Pelvic Exam:    Perineum: No Hemorrhoids, Normal Perineum   Vulva: Bartholin's, Urethra, Skene's normal   Vagina:  normal discharge   pH:    Cervix: no cervical motion tenderness   Adnexa: normal adnexa and no mass, fullness, tenderness   Bony Pelvis: gynecoid  System: Breast:  normal appearance, no masses or tenderness   Skin: normal coloration and turgor, no rashes    Neurologic: oriented, grossly non-focal   Extremities: normal strength, tone, and muscle mass   HEENT neck supple with midline trachea   Mouth/Teeth mucous membranes moist, pharynx normal without lesions   Neck supple and no masses   Cardiovascular: regular rate and rhythm, no murmurs or gallops   Respiratory:  appears well, vitals normal, no respiratory distress, acyanotic, normal RR, ear and throat exam is normal, neck free of mass or lymphadenopathy, chest  clear, no wheezing, crepitations, rhonchi, normal symmetric air entry   Abdomen: soft, non-tender; bowel sounds normal; no masses,  no organomegaly   Urinary: urethral meatus normal      Assessment:    Pregnancy: G1P0000 Patient Active Problem List   Diagnosis Date Noted  . Pure hypercholesterolemia 07/25/2013  . GERD (gastroesophageal reflux disease) 07/25/2013  . IBS (irritable bowel syndrome) 07/25/2013  . Lupus 07/24/2013  . Hypothyroidism 07/24/2013        Plan:     Initial labs drawn. Prenatal vitamins. Problem list reviewed and updated. Genetic Screening discussed First Screen: ordered.  Ultrasound discussed; fetal survey: ordered.  Follow up in 4 weeks. 50% of 30 min visit spent on counseling and coordination of care.  Will add TSH, CMET and 24 hr urine to initial labs due to hypothyroidism and Lupus.    [facility],Melissa Burch 02/13/2015

## 2015-02-16 ENCOUNTER — Other Ambulatory Visit (HOSPITAL_COMMUNITY): Payer: Self-pay | Admitting: Advanced Practice Midwife

## 2015-02-16 MED ORDER — LEVOTHYROXINE SODIUM 150 MCG PO TABS: 150.0000 ug | ORAL_TABLET | Freq: Every day | ORAL | Status: DC

## 2015-02-16 NOTE — Progress Notes (Unsigned)
Reviewed New OB labs  TSH was elevated  Results for Melissa Burch, Melissa Burch (MRN [id number]) as of 02/16/2015 16:44  Ref. Range 02/11/2015 09:03  TSH Latest Ref Range: 0.350-4.500 uIU/mL 7.515 (H)   Per Dr Despina HiddenEure, go up on dose from 125mcg to 150mcg and recheck in a month

## 2015-02-19 ENCOUNTER — Telehealth: Payer: Self-pay | Admitting: *Deleted

## 2015-02-19 NOTE — Telephone Encounter (Signed)
LM on voicemail of TSH results and per Wynelle BourgeoisMarie Williams, CNM she needs to increase her Synthroid to 150mcg.  [facility] [address]

## 2015-02-19 NOTE — Telephone Encounter (Signed)
-----   Message from Aviva SignsMarie L Williams, CNM sent at 02/16/2015  4:42 PM EDT ----- Regarding: Hypothyroid patient needs increased dose We did her New OB last week  She is on 125 of synthroid  Her TSH was 7  Per DR Despina HiddenEure, needs to go up to 150mg   Thanks Hilda LiasMarie

## 2015-03-04 ENCOUNTER — Other Ambulatory Visit: Payer: Self-pay | Admitting: *Deleted

## 2015-03-04 ENCOUNTER — Other Ambulatory Visit: Payer: 59 | Admitting: *Deleted

## 2015-03-04 VITALS — BP 107/68 | HR 76 | Resp 16

## 2015-03-04 DIAGNOSIS — Z3491 Encounter for supervision of normal pregnancy, unspecified, first trimester: Secondary | ICD-10-CM

## 2015-03-04 DIAGNOSIS — O99111 Other diseases of the blood and blood-forming organs and certain disorders involving the immune mechanism complicating pregnancy, first trimester: Principal | ICD-10-CM

## 2015-03-04 DIAGNOSIS — D6862 Lupus anticoagulant syndrome: Secondary | ICD-10-CM

## 2015-03-06 LAB — PROTEIN, URINE, 24 HOUR
PROTEIN 24H UR: 140 mg/d (ref <150)
PROTEIN, URINE: 5 mg/dL (ref 5–24)

## 2015-03-06 LAB — CREATININE CLEARANCE, URINE, 24 HOUR
CREATININE 24H UR: 1917 mg/d — AB (ref 700–1800)
CREATININE, URINE: 68.5 mg/dL
Creatinine Clearance: 218 mL/min — ABNORMAL HIGH (ref 75–115)
Creatinine: 0.61 mg/dL (ref 0.50–1.10)

## 2015-03-11 ENCOUNTER — Ambulatory Visit (INDEPENDENT_AMBULATORY_CARE_PROVIDER_SITE_OTHER): Payer: 59 | Admitting: Advanced Practice Midwife

## 2015-03-11 VITALS — BP 111/77 | HR 80 | Wt 173.0 lb

## 2015-03-11 DIAGNOSIS — E039 Hypothyroidism, unspecified: Secondary | ICD-10-CM

## 2015-03-11 DIAGNOSIS — Z3491 Encounter for supervision of normal pregnancy, unspecified, first trimester: Secondary | ICD-10-CM

## 2015-03-11 DIAGNOSIS — O0990 Supervision of high risk pregnancy, unspecified, unspecified trimester: Secondary | ICD-10-CM | POA: Insufficient documentation

## 2015-03-11 DIAGNOSIS — Z3401 Encounter for supervision of normal first pregnancy, first trimester: Secondary | ICD-10-CM

## 2015-03-11 NOTE — Patient Instructions (Signed)
First Trimester of Pregnancy The first trimester of pregnancy is from week 1 until the end of week 12 (months 1 through 3). A week after a sperm fertilizes an egg, the egg will implant on the wall of the uterus. This embryo will begin to develop into a baby. Genes from you and your partner are forming the baby. The female genes determine whether the baby is a boy or a girl. At 6-8 weeks, the eyes and face are formed, and the heartbeat can be seen on ultrasound. At the end of 12 weeks, all the baby's organs are formed.  Now that you are pregnant, you will want to do everything you can to have a healthy baby. Two of the most important things are to get good prenatal care and to follow your health care provider's instructions. Prenatal care is all the medical care you receive before the baby's birth. This care will help prevent, find, and treat any problems during the pregnancy and childbirth. BODY CHANGES Your body goes through many changes during pregnancy. The changes vary from woman to woman.   You may gain or lose a couple of pounds at first.  You may feel sick to your stomach (nauseous) and throw up (vomit). If the vomiting is uncontrollable, call your health care provider.  You may tire easily.  You may develop headaches that can be relieved by medicines approved by your health care provider.  You may urinate more often. Painful urination may mean you have a bladder infection.  You may develop heartburn as a result of your pregnancy.  You may develop constipation because certain hormones are causing the muscles that push waste through your intestines to slow down.  You may develop hemorrhoids or swollen, bulging veins (varicose veins).  Your breasts may begin to grow larger and become tender. Your nipples may stick out more, and the tissue that surrounds them (areola) may become darker.  Your gums may bleed and may be sensitive to brushing and flossing.  Dark spots or blotches (chloasma,  mask of pregnancy) may develop on your face. This will likely fade after the baby is born.  Your menstrual periods will stop.  You may have a loss of appetite.  You may develop cravings for certain kinds of food.  You may have changes in your emotions from day to day, such as being excited to be pregnant or being concerned that something may go wrong with the pregnancy and baby.  You may have more vivid and strange dreams.  You may have changes in your hair. These can include thickening of your hair, rapid growth, and changes in texture. Some women also have hair loss during or after pregnancy, or hair that feels dry or thin. Your hair will most likely return to normal after your baby is born. WHAT TO EXPECT AT YOUR PRENATAL VISITS During a routine prenatal visit:  You will be weighed to make sure you and the baby are growing normally.  Your blood pressure will be taken.  Your abdomen will be measured to track your baby's growth.  The fetal heartbeat will be listened to starting around week 10 or 12 of your pregnancy.  Test results from any previous visits will be discussed. Your health care provider may ask you:  How you are feeling.  If you are feeling the baby move.  If you have had any abnormal symptoms, such as leaking fluid, bleeding, severe headaches, or abdominal cramping.  If you have any questions. Other tests   that may be performed during your first trimester include:  Blood tests to find your blood type and to check for the presence of any previous infections. They will also be used to check for low iron levels (anemia) and Rh antibodies. Later in the pregnancy, blood tests for diabetes will be done along with other tests if problems develop.  Urine tests to check for infections, diabetes, or protein in the urine.  An ultrasound to confirm the proper growth and development of the baby.  An amniocentesis to check for possible genetic problems.  Fetal screens for  spina bifida and Down syndrome.  You may need other tests to make sure you and the baby are doing well. HOME CARE INSTRUCTIONS  Medicines  Follow your health care provider's instructions regarding medicine use. Specific medicines may be either safe or unsafe to take during pregnancy.  Take your prenatal vitamins as directed.  If you develop constipation, try taking a stool softener if your health care provider approves. Diet  Eat regular, well-balanced meals. Choose a variety of foods, such as meat or vegetable-based protein, fish, milk and low-fat dairy products, vegetables, fruits, and whole grain breads and cereals. Your health care provider will help you determine the amount of weight gain that is right for you.  Avoid raw meat and uncooked cheese. These carry germs that can cause birth defects in the baby.  Eating four or five small meals rather than three large meals a day may help relieve nausea and vomiting. If you start to feel nauseous, eating a few soda crackers can be helpful. Drinking liquids between meals instead of during meals also seems to help nausea and vomiting.  If you develop constipation, eat more high-fiber foods, such as fresh vegetables or fruit and whole grains. Drink enough fluids to keep your urine clear or pale yellow. Activity and Exercise  Exercise only as directed by your health care provider. Exercising will help you:  Control your weight.  Stay in shape.  Be prepared for labor and delivery.  Experiencing pain or cramping in the lower abdomen or low back is a good sign that you should stop exercising. Check with your health care provider before continuing normal exercises.  Try to avoid standing for long periods of time. Move your legs often if you must stand in one place for a long time.  Avoid heavy lifting.  Wear low-heeled shoes, and practice good posture.  You may continue to have sex unless your health care provider directs you  otherwise. Relief of Pain or Discomfort  Wear a good support bra for breast tenderness.   Take warm sitz baths to soothe any pain or discomfort caused by hemorrhoids. Use hemorrhoid cream if your health care provider approves.   Rest with your legs elevated if you have leg cramps or low back pain.  If you develop varicose veins in your legs, wear support hose. Elevate your feet for 15 minutes, 3-4 times a day. Limit salt in your diet. Prenatal Care  Schedule your prenatal visits by the twelfth week of pregnancy. They are usually scheduled monthly at first, then more often in the last 2 months before delivery.  Write down your questions. Take them to your prenatal visits.  Keep all your prenatal visits as directed by your health care provider. Safety  Wear your seat belt at all times when driving.  Make a list of emergency phone numbers, including numbers for family, friends, the hospital, and police and fire departments. General Tips    Ask your health care provider for a referral to a local prenatal education class. Begin classes no later than at the beginning of month 6 of your pregnancy.  Ask for help if you have counseling or nutritional needs during pregnancy. Your health care provider can offer advice or refer you to specialists for help with various needs.  Do not use hot tubs, steam rooms, or saunas.  Do not douche or use tampons or scented sanitary pads.  Do not cross your legs for long periods of time.  Avoid cat litter boxes and soil used by cats. These carry germs that can cause birth defects in the baby and possibly loss of the fetus by miscarriage or stillbirth.  Avoid all smoking, herbs, alcohol, and medicines not prescribed by your health care provider. Chemicals in these affect the formation and growth of the baby.  Schedule a dentist appointment. At home, brush your teeth with a soft toothbrush and be gentle when you floss. SEEK MEDICAL CARE IF:   You have  dizziness.  You have mild pelvic cramps, pelvic pressure, or nagging pain in the abdominal area.  You have persistent nausea, vomiting, or diarrhea.  You have a bad smelling vaginal discharge.  You have pain with urination.  You notice increased swelling in your face, hands, legs, or ankles. SEEK IMMEDIATE MEDICAL CARE IF:   You have a fever.  You are leaking fluid from your vagina.  You have spotting or bleeding from your vagina.  You have severe abdominal cramping or pain.  You have rapid weight gain or loss.  You vomit blood or material that looks like coffee grounds.  You are exposed to German measles and have never had them.  You are exposed to fifth disease or chickenpox.  You develop a severe headache.  You have shortness of breath.  You have any kind of trauma, such as from a fall or a car accident. Document Released: 09/01/2001 Document Revised: 01/22/2014 Document Reviewed: 07/18/2013 ExitCare Patient Information 2015 [employer]. This information is not intended to replace advice given to you by your health care provider. Make sure you discuss any questions you have with your health care provider.  

## 2015-03-11 NOTE — Progress Notes (Signed)
Reviewed NOB labs.   Subjective:  Melissa Burch is a 29 y.o. G1P0000 at [redacted]w[redacted]d being seen today for ongoing prenatal care.  Patient reports no complaints.   .  Vag. Bleeding: None.  . Denies leaking of fluid.   The following portions of the patient's history were reviewed and updated as appropriate: allergies, current medications, past family history, past medical history, past social history, past surgical history and problem list.   Objective:   Filed Vitals:   03/11/15 0818  BP: 111/77  Pulse: 80  Weight: 173 lb (78.472 kg)    Fetal Status: Fetal Heart Rate (bpm): 155         General:  Alert, oriented and cooperative. Patient is in no acute distress.  Skin: Skin is warm and dry. No rash noted.   Cardiovascular: Normal heart rate noted  Respiratory: Effort and breath sounds normal, no problems with respiration noted  Abdomen: Soft, gravid, appropriate for gestational age. Pain/Pressure: Absent     Vaginal: Vag. Bleeding: None.    Vag D/C Character: Thin  Cervix: Not evaluated       Extremities: Normal range of motion.  Edema: None  Mental Status: Normal mood and affect. Normal behavior. Normal judgment and thought content.   Urinalysis: Urine Protein: Negative Urine Glucose: Negative  Assessment and Plan:  Pregnancy: G1P0000 at [redacted]w[redacted]d  1. Hypothyroidism, unspecified hypothyroidism type Will have TSH checked by Rheumatologist.   2. Supervision of normal pregnancy in first trimester  - [place] MFM Fetal Nuchal Translucency; Future   Preterm labor symptoms and general obstetric precautions including but not limited to vaginal bleeding, contractions, leaking of fluid and fetal movement were reviewed in detail with the patient.  Please refer to After Visit Summary for other counseling recommendations.   F/U 4 weeks  Dorathy Kinsman, [place]

## 2015-03-18 ENCOUNTER — Ambulatory Visit (HOSPITAL_COMMUNITY)
Admission: RE | Admit: 2015-03-18 | Discharge: 2015-03-18 | Disposition: A | Discharge status: Home / Self Care | Payer: 59 | Source: Ambulatory Visit | Attending: Advanced Practice Midwife | Admitting: Advanced Practice Midwife

## 2015-03-18 ENCOUNTER — Encounter (HOSPITAL_COMMUNITY): Payer: Self-pay

## 2015-03-18 DIAGNOSIS — Z3491 Encounter for supervision of normal pregnancy, unspecified, first trimester: Secondary | ICD-10-CM

## 2015-03-18 DIAGNOSIS — Z3A12 12 weeks gestation of pregnancy: Secondary | ICD-10-CM | POA: Diagnosis not present

## 2015-03-18 DIAGNOSIS — Z36 Encounter for antenatal screening of mother: Secondary | ICD-10-CM | POA: Insufficient documentation

## 2015-03-18 DIAGNOSIS — E039 Hypothyroidism, unspecified: Secondary | ICD-10-CM | POA: Diagnosis not present

## 2015-03-18 DIAGNOSIS — M329 Systemic lupus erythematosus, unspecified: Secondary | ICD-10-CM | POA: Insufficient documentation

## 2015-03-18 DIAGNOSIS — O99281 Endocrine, nutritional and metabolic diseases complicating pregnancy, first trimester: Secondary | ICD-10-CM | POA: Diagnosis not present

## 2015-03-18 DIAGNOSIS — O9989 Other specified diseases and conditions complicating pregnancy, childbirth and the puerperium: Secondary | ICD-10-CM

## 2015-03-18 DIAGNOSIS — Z369 Encounter for antenatal screening, unspecified: Secondary | ICD-10-CM | POA: Insufficient documentation

## 2015-03-29 ENCOUNTER — Other Ambulatory Visit (HOSPITAL_COMMUNITY): Payer: Self-pay | Admitting: Advanced Practice Midwife

## 2015-04-08 ENCOUNTER — Ambulatory Visit (INDEPENDENT_AMBULATORY_CARE_PROVIDER_SITE_OTHER): Payer: 59 | Admitting: Advanced Practice Midwife

## 2015-04-08 VITALS — BP 103/60 | HR 83 | Wt 176.0 lb

## 2015-04-08 DIAGNOSIS — Z3402 Encounter for supervision of normal first pregnancy, second trimester: Secondary | ICD-10-CM

## 2015-04-08 DIAGNOSIS — D6862 Lupus anticoagulant syndrome: Secondary | ICD-10-CM

## 2015-04-08 DIAGNOSIS — Z36 Encounter for antenatal screening of mother: Secondary | ICD-10-CM

## 2015-04-08 DIAGNOSIS — O99111 Other diseases of the blood and blood-forming organs and certain disorders involving the immune mechanism complicating pregnancy, first trimester: Secondary | ICD-10-CM

## 2015-04-08 DIAGNOSIS — Z3492 Encounter for supervision of normal pregnancy, unspecified, second trimester: Secondary | ICD-10-CM

## 2015-04-08 DIAGNOSIS — E039 Hypothyroidism, unspecified: Secondary | ICD-10-CM

## 2015-04-09 LAB — ALPHA FETOPROTEIN, MATERNAL
AFP: 32.8 ng/mL
CURR GEST AGE: 15.6 wks.days
MoM for AFP: 1.11
OPEN SPINA BIFIDA: NEGATIVE

## 2015-04-10 ENCOUNTER — Encounter: Payer: Self-pay | Admitting: Advanced Practice Midwife

## 2015-04-10 NOTE — Patient Instructions (Signed)
Hypothyroidism and Pregnancy Hypothyroidism is a common condition seen in women more than men. It means you have an under-active thyroid gland. The thyroid gland is a hormone gland. It is located in your neck in front of your windpipe. This gland is controlled by the pituitary gland in your brain. The pituitary gland produces thyroid stimulating hormone (TSH). TSH controls the amount of thyroid hormone (TH) produced by the thyroid gland. With hypothyroidism, the gland does not produce enough TH. The body needs this hormone for metabolism. Metabolism is how your body works and handles food.  A baby (fetus) needs to get thyroid hormone from the mother. It is needed for normal growth and brain development. Babies who are born to mothers with hypothyroidism during pregnancy may have lowered IQ scores. They may also have low birth weight or be born prematurely. Their body movement may develop poorly, too. Common problems during pregnancy are fatigue and weight gain. These symptoms may be hidden by the pregnancy. Hypothyroidism can develop before or during pregnancy.  CAUSES   Thyroid gland abnormality.  The pituitary gland in the brain produces too much TSH.  The most common cause before, during and after pregnancy is chronic (lasting) thyroiditis. It is an autoimmune condition that affects the thyroid cells. This is called Hashimoto's disease.  Surgical removal of the thyroid (thyroidectomy).  Radioactive iodine treatment of the thyroid gland that can destroy the gland. This is not used during pregnancy since it can affect the baby.  Lack of iodine in your diet. Most salt products contains iodine.  Women with Type 1 diabetes have a 5 to 8% chance of developing hypothyroid disease while pregnant. Type 1 diabetic women have a 25% chance of developing the disease after they have their baby. SYMPTOMS  Symptoms of hypothyroidism can develop slowly. They can go undetected if the symptoms are mild. This can  be prevented with early detection in the pregnant mother. When you are pregnant, a mild form of hypothyroid disease can develop into a full blown disease because of an increase in the metabolism (destruction) of thyroid hormone in your body during pregnancy. If you are considering pregnancy, and you or an immediate family member have had problems thyroid problems, tell your caregiver. Make sure that you are watched closely as your caregiver suggests. Some problems you may have before or during pregnancy are:  Constipation.  Fatigue.  Intolerance to cold.  Mental weariness. More advanced problems with hypothyroidism may include:   Hoarseness.  Swelling of the lower legs.  Dry and thickened skin.  Slowness of thinking.  Decreased sex drive (libido).  Slowed speech.  Weight gain.  Muscle cramps.  Insomnia.  Slow reflexes.  Changes in your voice (deeper).  Puffy face and feet.  Thin, coarse hair.  Thinning of eyebrows.  Decreased appetite.  Increase incidence of carpal tunnel syndrome.  Coma. Signs of Hypothyroidism include:  Enlarged thyroid gland (goiter).  Small round growths (nodules) in the thyroid gland.  Increase in thyroid stimulating hormone. (A blood test is needed.)  Decrease in thyroid hormone. (A blood test is needed.) Common problems before pregnancy can include:  Inability to get pregnant.  Changes in menstrual periods.  No menstrual period (amenorrhea).  Miscarriage. Common thyroid problems during or after pregnancy can include:  The development of high blood pressure and the chance of a premature delivery are more common.  Babies born to women with untreated hypothyroidism may not have good development of the brain.  Preterm delivery.  Low birth   weight babies.  Preeclampsia.  Placental abruption.  Cretinism in baby (mental retardation, failure to grow, nerve (neurologic) and psychological problems).  Stillbirth. DIAGNOSIS   Diagnosis is based upon the signs and symptoms of the patient. It is confirmed by blood tests ( increased TSH and decreased TH), ultrasound, and radioactive iodine uptake tests. The radioactive iodine uptake test is not done when you are pregnant. When hypothyroidism is diagnosed early, it can be treated. There should be no problems for you or your baby. Goiter or thyroid nodules found in a pregnant woman should be tested to be sure there is no cancer present. Anyone with a history or family history of thyroid disease should be tested for thyroid disease. TREATMENT  When hypothyroidism is diagnosed early, it can be treated. Treatment during pregnancy should not cause any harm to your baby.  Your caregiver will watch your thyroid hormones (TSH and TH) closely during the pregnancy.  Increase or decrease the dose of thyroid medication as your caregiver advises. This medicine is safe for you and your baby.  Avoid medications or supplements that can block the absorption of the hormone you are taking. For instance, calcium and iron are medications that may decrease the benefits of your thyroid medicine. Taking medications several hours apart will help this.  THS and TH should be checked each month of the pregnancy. At times, they should be checked more often in the third trimester.  All the states, including [place], [place], offer screening tests for hypothyroidism in newborn babies.  If this disease is treated in the first few weeks after the baby is born, it can prevent abnormal growth and mental retardation. The pediatrician should be informed if the mother had hypothyroidism. HOME CARE INSTRUCTIONS   Avoid medications or supplements, such as calcium and iron, that can block the absorption of the hormone you are taking. If you must take these medications, take them several hours apart.  Pregnant women should take multivitamins that contain iodine. Your caregiver may suggest that you take 220mcg of  iodine.  Women planning to get pregnant should take a multivitamin containing iodine. Your caregiver may suggest that you take 150mcg of iodine.  Include foods in your diet that contain iodine, such as iodized salt, spinach and shell fish.  Follow the advice of your caregiver regarding your medication and getting the necessary blood tests.  Get tested for thyroid disease before getting pregnant if you or someone in your family has had thyroid problems.  Get tested if you think you have symptoms of thyroid disease. SEEK IMMEDIATE MEDICAL CARE IF:   You have decreased or no movements of the baby.  You develop muscle cramps.  You have belly (abdominal) pain.  You gain too much weight in one week (5 pounds or more).  You develop a severe headache or vision problems.  You develop severe swelling of your legs and ankles.  You develop a lump in your neck.  You think you have symptoms of thyroid disease. MAKE SURE YOU:   Understand these instructions.  Will watch your condition.  Will get help right away if you are not doing well or get worse. Document Released: 07/05/2007 Document Revised: 11/30/2011 Document Reviewed: 03/04/2009 ExitCare Patient Information 2015 [employer]. This information is not intended to replace advice given to you by your health care provider. Make sure you discuss any questions you have with your health care provider.  

## 2015-04-10 NOTE — Progress Notes (Signed)
Subjective:  Melissa HatchetMaggie Burch is a 29 y.o. G1P0000 at 8039w1d being seen today for ongoing prenatal care.  Patient reports no complaints and is already feeling better after increasing her Synthroid dose.   .  Vag. Bleeding: None.  . Denies leaking of fluid.   The following portions of the patient's history were reviewed and updated as appropriate: allergies, current medications, past family history, past medical history, past social history, past surgical history and problem list.   Objective:   Filed Vitals:   04/08/15 0827  BP: 103/60  Pulse: 83  Weight: 176 lb (79.833 kg)    Fetal Status: Fetal Heart Rate (bpm): 145         General:  Alert, oriented and cooperative. Patient is in no acute distress.  Skin: Skin is warm and dry. No rash noted.   Cardiovascular: Normal heart rate noted  Respiratory: Normal respiratory effort, no problems with respiration noted  Abdomen: Soft, gravid, appropriate for gestational age. Pain/Pressure: Absent     Vaginal: Vag. Bleeding: None.    Vag D/C Character: Thin  Cervix: Not evaluated        Extremities: Normal range of motion.  Edema: None  Mental Status: Normal mood and affect. Normal behavior. Normal judgment and thought content.   Urinalysis: Urine Protein: Negative Urine Glucose: Negative  Assessment and Plan:  Pregnancy: G1P0000 at 9739w1d  1. Supervision of normal pregnancy, second trimester  - Alpha fetoprotein, maternal  Preterm labor symptoms and general obstetric precautions including but not limited to vaginal bleeding, contractions, leaking of fluid and fetal movement were reviewed in detail with the patient.  Please refer to After Visit Summary for other counseling recommendations.   2.  Hypothyroidism Reviewed we may need to continue adjusting her dose of Synthroid based on higher needs in pregnancy. Has an endocrinologist in [place] who is following her.  Return to office in 4 weeks for prenatal visit. Will schedule anatomy US prior  to that visit.    Aviva SignsMarie L Khalik Pewitt, CNM

## 2015-04-23 ENCOUNTER — Other Ambulatory Visit: Payer: Self-pay | Admitting: *Deleted

## 2015-04-23 DIAGNOSIS — E039 Hypothyroidism, unspecified: Secondary | ICD-10-CM

## 2015-04-23 MED ORDER — LEVOTHYROXINE SODIUM 150 MCG PO TABS: 150.0000 ug | ORAL_TABLET | Freq: Every day | ORAL | Status: DC

## 2015-04-23 NOTE — Telephone Encounter (Signed)
Rf authorization sent to [facility] on [address] for Synthroid 

## 2015-04-25 ENCOUNTER — Other Ambulatory Visit: Payer: Self-pay | Admitting: Advanced Practice Midwife

## 2015-04-25 DIAGNOSIS — O99282 Endocrine, nutritional and metabolic diseases complicating pregnancy, second trimester: Secondary | ICD-10-CM

## 2015-04-25 DIAGNOSIS — Z3689 Encounter for other specified antenatal screening: Secondary | ICD-10-CM

## 2015-04-25 DIAGNOSIS — Z3A18 18 weeks gestation of pregnancy: Secondary | ICD-10-CM

## 2015-04-25 DIAGNOSIS — M329 Systemic lupus erythematosus, unspecified: Secondary | ICD-10-CM

## 2015-04-25 DIAGNOSIS — E039 Hypothyroidism, unspecified: Secondary | ICD-10-CM

## 2015-04-25 DIAGNOSIS — O26892 Other specified pregnancy related conditions, second trimester: Secondary | ICD-10-CM

## 2015-04-26 ENCOUNTER — Other Ambulatory Visit: Payer: Self-pay | Admitting: Advanced Practice Midwife

## 2015-04-26 ENCOUNTER — Ambulatory Visit (HOSPITAL_COMMUNITY)
Admission: RE | Admit: 2015-04-26 | Discharge: 2015-04-26 | Disposition: A | Discharge status: Home / Self Care | Payer: 59 | Source: Ambulatory Visit | Attending: Physician Assistant | Admitting: Physician Assistant

## 2015-04-26 VITALS — BP 126/68 | HR 72 | Wt 174.0 lb

## 2015-04-26 DIAGNOSIS — Z3A18 18 weeks gestation of pregnancy: Secondary | ICD-10-CM

## 2015-04-26 DIAGNOSIS — M329 Systemic lupus erythematosus, unspecified: Secondary | ICD-10-CM

## 2015-04-26 DIAGNOSIS — Z3491 Encounter for supervision of normal pregnancy, unspecified, first trimester: Secondary | ICD-10-CM

## 2015-04-26 DIAGNOSIS — Z3689 Encounter for other specified antenatal screening: Secondary | ICD-10-CM

## 2015-04-26 DIAGNOSIS — O26892 Other specified pregnancy related conditions, second trimester: Secondary | ICD-10-CM | POA: Insufficient documentation

## 2015-04-26 DIAGNOSIS — O9989 Other specified diseases and conditions complicating pregnancy, childbirth and the puerperium: Secondary | ICD-10-CM

## 2015-04-26 DIAGNOSIS — Z36 Encounter for antenatal screening of mother: Secondary | ICD-10-CM | POA: Insufficient documentation

## 2015-04-26 DIAGNOSIS — O99282 Endocrine, nutritional and metabolic diseases complicating pregnancy, second trimester: Secondary | ICD-10-CM | POA: Diagnosis not present

## 2015-04-26 DIAGNOSIS — E039 Hypothyroidism, unspecified: Secondary | ICD-10-CM

## 2015-04-26 DIAGNOSIS — O99891 Other specified diseases and conditions complicating pregnancy: Secondary | ICD-10-CM

## 2015-04-26 DIAGNOSIS — Z3A24 24 weeks gestation of pregnancy: Secondary | ICD-10-CM

## 2015-05-03 ENCOUNTER — Ambulatory Visit (INDEPENDENT_AMBULATORY_CARE_PROVIDER_SITE_OTHER): Payer: 59 | Admitting: Advanced Practice Midwife

## 2015-05-03 VITALS — BP 105/65 | HR 78 | Wt 174.0 lb

## 2015-05-03 DIAGNOSIS — Z3402 Encounter for supervision of normal first pregnancy, second trimester: Secondary | ICD-10-CM

## 2015-05-03 DIAGNOSIS — M329 Systemic lupus erythematosus, unspecified: Secondary | ICD-10-CM

## 2015-05-03 DIAGNOSIS — E039 Hypothyroidism, unspecified: Secondary | ICD-10-CM

## 2015-05-03 DIAGNOSIS — Z3492 Encounter for supervision of normal pregnancy, unspecified, second trimester: Secondary | ICD-10-CM

## 2015-05-03 NOTE — Patient Instructions (Signed)
Second Trimester of Pregnancy The second trimester is from week 13 through week 28, months 4 through 6. The second trimester is often a time when you feel your best. Your body has also adjusted to being pregnant, and you begin to feel better physically. Usually, morning sickness has lessened or quit completely, you may have more energy, and you may have an increase in appetite. The second trimester is also a time when the fetus is growing rapidly. At the end of the sixth month, the fetus is about 9 inches long and weighs about 1 pounds. You will likely begin to feel the baby move (quickening) between 18 and 20 weeks of the pregnancy. BODY CHANGES Your body goes through many changes during pregnancy. The changes vary from woman to woman.   Your weight will continue to increase. You will notice your lower abdomen bulging out.  You may begin to get stretch marks on your hips, abdomen, and breasts.  You may develop headaches that can be relieved by medicines approved by your health care provider.  You may urinate more often because the fetus is pressing on your bladder.  You may develop or continue to have heartburn as a result of your pregnancy.  You may develop constipation because certain hormones are causing the muscles that push waste through your intestines to slow down.  You may develop hemorrhoids or swollen, bulging veins (varicose veins).  You may have back pain because of the weight gain and pregnancy hormones relaxing your joints between the bones in your pelvis and as a result of a shift in weight and the muscles that support your balance.  Your breasts will continue to grow and be tender.  Your gums may bleed and may be sensitive to brushing and flossing.  Dark spots or blotches (chloasma, mask of pregnancy) may develop on your face. This will likely fade after the baby is born.  A dark line from your belly button to the pubic area (linea nigra) may appear. This will likely fade  after the baby is born.  You may have changes in your hair. These can include thickening of your hair, rapid growth, and changes in texture. Some women also have hair loss during or after pregnancy, or hair that feels dry or thin. Your hair will most likely return to normal after your baby is born. WHAT TO EXPECT AT YOUR PRENATAL VISITS During a routine prenatal visit:  You will be weighed to make sure you and the fetus are growing normally.  Your blood pressure will be taken.  Your abdomen will be measured to track your baby's growth.  The fetal heartbeat will be listened to.  Any test results from the previous visit will be discussed. Your health care provider may ask you:  How you are feeling.  If you are feeling the baby move.  If you have had any abnormal symptoms, such as leaking fluid, bleeding, severe headaches, or abdominal cramping.  If you have any questions. Other tests that may be performed during your second trimester include:  Blood tests that check for:  Low iron levels (anemia).  Gestational diabetes (between 24 and 28 weeks).  Rh antibodies.  Urine tests to check for infections, diabetes, or protein in the urine.  An ultrasound to confirm the proper growth and development of the baby.  An amniocentesis to check for possible genetic problems.  Fetal screens for spina bifida and Down syndrome. HOME CARE INSTRUCTIONS   Avoid all smoking, herbs, alcohol, and unprescribed   drugs. These chemicals affect the formation and growth of the baby.  Follow your health care provider's instructions regarding medicine use. There are medicines that are either safe or unsafe to take during pregnancy.  Exercise only as directed by your health care provider. Experiencing uterine cramps is a good sign to stop exercising.  Continue to eat regular, healthy meals.  Wear a good support bra for breast tenderness.  Do not use hot tubs, steam rooms, or saunas.  Wear your  seat belt at all times when driving.  Avoid raw meat, uncooked cheese, cat litter boxes, and soil used by cats. These carry germs that can cause birth defects in the baby.  Take your prenatal vitamins.  Try taking a stool softener (if your health care provider approves) if you develop constipation. Eat more high-fiber foods, such as fresh vegetables or fruit and whole grains. Drink plenty of fluids to keep your urine clear or pale yellow.  Take warm sitz baths to soothe any pain or discomfort caused by hemorrhoids. Use hemorrhoid cream if your health care provider approves.  If you develop varicose veins, wear support hose. Elevate your feet for 15 minutes, 3-4 times a day. Limit salt in your diet.  Avoid heavy lifting, wear low heel shoes, and practice good posture.  Rest with your legs elevated if you have leg cramps or low back pain.  Visit your dentist if you have not gone yet during your pregnancy. Use a soft toothbrush to brush your teeth and be gentle when you floss.  A sexual relationship may be continued unless your health care provider directs you otherwise.  Continue to go to all your prenatal visits as directed by your health care provider. SEEK MEDICAL CARE IF:   You have dizziness.  You have mild pelvic cramps, pelvic pressure, or nagging pain in the abdominal area.  You have persistent nausea, vomiting, or diarrhea.  You have a bad smelling vaginal discharge.  You have pain with urination. SEEK IMMEDIATE MEDICAL CARE IF:   You have a fever.  You are leaking fluid from your vagina.  You have spotting or bleeding from your vagina.  You have severe abdominal cramping or pain.  You have rapid weight gain or loss.  You have shortness of breath with chest pain.  You notice sudden or extreme swelling of your face, hands, ankles, feet, or legs.  You have not felt your baby move in over an hour.  You have severe headaches that do not go away with  medicine.  You have vision changes. Document Released: 09/01/2001 Document Revised: 09/12/2013 Document Reviewed: 11/08/2012 [employer] Patient Information 2015 [employer]. This information is not intended to replace advice given to you by your health care provider. Make sure you discuss any questions you have with your health care provider.  

## 2015-05-03 NOTE — Progress Notes (Signed)
Subjective:  Melissa Burch is a 29 y.o. G1P0000 at [redacted]w[redacted]d being seen today for ongoing prenatal care.  Patient reports no complaints.  Contractions: Not present.  Vag. Bleeding: None. Movement: Present. Denies leaking of fluid.   The following portions of the patient's history were reviewed and updated as appropriate: allergies, current medications, past family history, past medical history, past social history, past surgical history and problem list.   Objective:   Filed Vitals:   05/03/15 0851  BP: 105/65  Pulse: 78  Weight: 174 lb (78.926 kg)    Fetal Status:     Movement: Present     General:  Alert, oriented and cooperative. Patient is in no acute distress.  Skin: Skin is warm and dry. No rash noted.   Cardiovascular: Normal heart rate noted  Respiratory: Normal respiratory effort, no problems with respiration noted  Abdomen: Soft, gravid, appropriate for gestational age. Pain/Pressure: Absent     Pelvic: Vag. Bleeding: None Vag D/C Character: Thin   Cervical exam deferred        Extremities: Normal range of motion.  Edema: None  Mental Status: Normal mood and affect. Normal behavior. Normal judgment and thought content.   Urinalysis: Urine Protein: Negative Urine Glucose: Negative  Assessment and Plan:  Pregnancy: G1P0000 at [redacted]w[redacted]d  1. Supervision of normal pregnancy, second trimester      Doing well, good energy, no nausea  2.  Hypothyroidism      Endocrinologist to draw TSH in October  3. Lupus    Normotensive, doing well.  - [place] MFM OB FOLLOW UP; Future   Preterm labor symptoms and general obstetric precautions including but not limited to vaginal bleeding, contractions, leaking of fluid and fetal movement were reviewed in detail with the patient. Please refer to After Visit Summary for other counseling recommendations.  Return in about 4 weeks (around 05/31/2015) for [employer].   Aviva Signs, CNM

## 2015-05-06 ENCOUNTER — Other Ambulatory Visit: Payer: Self-pay | Admitting: Advanced Practice Midwife

## 2015-05-06 DIAGNOSIS — E039 Hypothyroidism, unspecified: Secondary | ICD-10-CM

## 2015-05-06 DIAGNOSIS — Z3A24 24 weeks gestation of pregnancy: Secondary | ICD-10-CM

## 2015-05-06 DIAGNOSIS — O99282 Endocrine, nutritional and metabolic diseases complicating pregnancy, second trimester: Secondary | ICD-10-CM

## 2015-05-06 DIAGNOSIS — O26892 Other specified pregnancy related conditions, second trimester: Secondary | ICD-10-CM

## 2015-05-06 DIAGNOSIS — M329 Systemic lupus erythematosus, unspecified: Secondary | ICD-10-CM

## 2015-06-07 ENCOUNTER — Other Ambulatory Visit (HOSPITAL_COMMUNITY): Payer: Self-pay | Admitting: Obstetrics and Gynecology

## 2015-06-07 ENCOUNTER — Encounter (HOSPITAL_COMMUNITY): Payer: Self-pay

## 2015-06-07 ENCOUNTER — Ambulatory Visit (HOSPITAL_COMMUNITY)
Admission: RE | Admit: 2015-06-07 | Discharge: 2015-06-07 | Disposition: A | Discharge status: Home / Self Care | Payer: 59 | Source: Ambulatory Visit | Attending: Family Medicine | Admitting: Family Medicine

## 2015-06-07 VITALS — BP 114/67 | HR 67 | Wt 180.0 lb

## 2015-06-07 DIAGNOSIS — O9989 Other specified diseases and conditions complicating pregnancy, childbirth and the puerperium: Secondary | ICD-10-CM

## 2015-06-07 DIAGNOSIS — O99282 Endocrine, nutritional and metabolic diseases complicating pregnancy, second trimester: Secondary | ICD-10-CM | POA: Insufficient documentation

## 2015-06-07 DIAGNOSIS — O26892 Other specified pregnancy related conditions, second trimester: Secondary | ICD-10-CM | POA: Insufficient documentation

## 2015-06-07 DIAGNOSIS — E039 Hypothyroidism, unspecified: Secondary | ICD-10-CM

## 2015-06-07 DIAGNOSIS — O26899 Other specified pregnancy related conditions, unspecified trimester: Principal | ICD-10-CM

## 2015-06-07 DIAGNOSIS — Z3A24 24 weeks gestation of pregnancy: Secondary | ICD-10-CM | POA: Insufficient documentation

## 2015-06-07 DIAGNOSIS — M329 Systemic lupus erythematosus, unspecified: Secondary | ICD-10-CM

## 2015-06-07 DIAGNOSIS — Z3491 Encounter for supervision of normal pregnancy, unspecified, first trimester: Secondary | ICD-10-CM

## 2015-06-07 DIAGNOSIS — Z3A28 28 weeks gestation of pregnancy: Secondary | ICD-10-CM

## 2015-06-07 DIAGNOSIS — O99280 Endocrine, nutritional and metabolic diseases complicating pregnancy, unspecified trimester: Secondary | ICD-10-CM

## 2015-06-10 ENCOUNTER — Ambulatory Visit (INDEPENDENT_AMBULATORY_CARE_PROVIDER_SITE_OTHER): Payer: 59 | Admitting: Advanced Practice Midwife

## 2015-06-10 VITALS — BP 123/71 | HR 88 | Wt 182.0 lb

## 2015-06-10 DIAGNOSIS — O99891 Other specified diseases and conditions complicating pregnancy: Secondary | ICD-10-CM

## 2015-06-10 DIAGNOSIS — M329 Systemic lupus erythematosus, unspecified: Secondary | ICD-10-CM

## 2015-06-10 DIAGNOSIS — O9989 Other specified diseases and conditions complicating pregnancy, childbirth and the puerperium: Secondary | ICD-10-CM

## 2015-06-10 DIAGNOSIS — Z3491 Encounter for supervision of normal pregnancy, unspecified, first trimester: Secondary | ICD-10-CM

## 2015-06-10 DIAGNOSIS — Z3482 Encounter for supervision of other normal pregnancy, second trimester: Secondary | ICD-10-CM

## 2015-06-10 DIAGNOSIS — O26891 Other specified pregnancy related conditions, first trimester: Secondary | ICD-10-CM

## 2015-06-10 NOTE — Patient Instructions (Signed)
Cardiolipin Antibodies  Second Trimester of Pregnancy The second trimester is from week 13 through week 28, months 4 through 6. The second trimester is often a time when you feel your best. Your body has also adjusted to being pregnant, and you begin to feel better physically. Usually, morning sickness has lessened or quit completely, you may have more energy, and you may have an increase in appetite. The second trimester is also a time when the fetus is growing rapidly. At the end of the sixth month, the fetus is about 9 inches long and weighs about 1 pounds. You will likely begin to feel the baby move (quickening) between 18 and 20 weeks of the pregnancy. BODY CHANGES Your body goes through many changes during pregnancy. The changes vary from woman to woman.   Your weight will continue to increase. You will notice your lower abdomen bulging out.  You may begin to get stretch marks on your hips, abdomen, and breasts.  You may develop headaches that can be relieved by medicines approved by your health care provider.  You may urinate more often because the fetus is pressing on your bladder.  You may develop or continue to have heartburn as a result of your pregnancy.  You may develop constipation because certain hormones are causing the muscles that push waste through your intestines to slow down.  You may develop hemorrhoids or swollen, bulging veins (varicose veins).  You may have back pain because of the weight gain and pregnancy hormones relaxing your joints between the bones in your pelvis and as a result of a shift in weight and the muscles that support your balance.  Your breasts will continue to grow and be tender.  Your gums may bleed and may be sensitive to brushing and flossing.  Dark spots or blotches (chloasma, mask of pregnancy) may develop on your face. This will likely fade after the baby is born.  A dark line from your belly button to the pubic area (linea nigra) may  appear. This will likely fade after the baby is born.  You may have changes in your hair. These can include thickening of your hair, rapid growth, and changes in texture. Some women also have hair loss during or after pregnancy, or hair that feels dry or thin. Your hair will most likely return to normal after your baby is born. WHAT TO EXPECT AT YOUR PRENATAL VISITS During a routine prenatal visit:  You will be weighed to make sure you and the fetus are growing normally.  Your blood pressure will be taken.  Your abdomen will be measured to track your baby's growth.  The fetal heartbeat will be listened to.  Any test results from the previous visit will be discussed. Your health care provider may ask you:  How you are feeling.  If you are feeling the baby move.  If you have had any abnormal symptoms, such as leaking fluid, bleeding, severe headaches, or abdominal cramping.  If you have any questions. Other tests that may be performed during your second trimester include:  Blood tests that check for:  Low iron levels (anemia).  Gestational diabetes (between 24 and 28 weeks).  Rh antibodies.  Urine tests to check for infections, diabetes, or protein in the urine.  An ultrasound to confirm the proper growth and development of the baby.  An amniocentesis to check for possible genetic problems.  Fetal screens for spina bifida and Down syndrome. HOME CARE INSTRUCTIONS   Avoid all smoking, herbs,  alcohol, and unprescribed drugs. These chemicals affect the formation and growth of the baby.  Follow your health care provider's instructions regarding medicine use. There are medicines that are either safe or unsafe to take during pregnancy.  Exercise only as directed by your health care provider. Experiencing uterine cramps is a good sign to stop exercising.  Continue to eat regular, healthy meals.  Wear a good support bra for breast tenderness.  Do not use hot tubs, steam  rooms, or saunas.  Wear your seat belt at all times when driving.  Avoid raw meat, uncooked cheese, cat litter boxes, and soil used by cats. These carry germs that can cause birth defects in the baby.  Take your prenatal vitamins.  Try taking a stool softener (if your health care provider approves) if you develop constipation. Eat more high-fiber foods, such as fresh vegetables or fruit and whole grains. Drink plenty of fluids to keep your urine clear or pale yellow.  Take warm sitz baths to soothe any pain or discomfort caused by hemorrhoids. Use hemorrhoid cream if your health care provider approves.  If you develop varicose veins, wear support hose. Elevate your feet for 15 minutes, 3-4 times a day. Limit salt in your diet.  Avoid heavy lifting, wear low heel shoes, and practice good posture.  Rest with your legs elevated if you have leg cramps or low back pain.  Visit your dentist if you have not gone yet during your pregnancy. Use a soft toothbrush to brush your teeth and be gentle when you floss.  A sexual relationship may be continued unless your health care provider directs you otherwise.  Continue to go to all your prenatal visits as directed by your health care provider. SEEK MEDICAL CARE IF:   You have dizziness.  You have mild pelvic cramps, pelvic pressure, or nagging pain in the abdominal area.  You have persistent nausea, vomiting, or diarrhea.  You have a bad smelling vaginal discharge.  You have pain with urination. SEEK IMMEDIATE MEDICAL CARE IF:   You have a fever.  You are leaking fluid from your vagina.  You have spotting or bleeding from your vagina.  You have severe abdominal cramping or pain.  You have rapid weight gain or loss.  You have shortness of breath with chest pain.  You notice sudden or extreme swelling of your face, hands, ankles, feet, or legs.  You have not felt your baby move in over an hour.  You have severe headaches that do  not go away with medicine.  You have vision changes. Document Released: 09/01/2001 Document Revised: 09/12/2013 Document Reviewed: 11/08/2012 [facility] Patient Information 2015 [place], [place]. This information is not intended to replace advice given to you by your health care provider. Make sure you discuss any questions you have with your health care provider.   Tdap Vaccine (Tetanus, Diphtheria, Pertussis): What You Need to Know 1. Why get vaccinated? Tetanus, diphtheria and pertussis can be very serious diseases, even for adolescents and adults. Tdap vaccine can protect [place] from these diseases. TETANUS (Lockjaw) causes painful muscle tightening and stiffness, usually all over the body.  It can lead to tightening of muscles in the head and neck so you can't open your mouth, swallow, or sometimes even breathe. Tetanus kills about 1 out of 5 people who are infected. DIPHTHERIA can cause a thick coating to form in the back of the throat.  It can lead to breathing problems, paralysis, heart failure, and death. PERTUSSIS (Whooping Cough)  causes severe coughing spells, which can cause difficulty breathing, vomiting and disturbed sleep.  It can also lead to weight loss, incontinence, and rib fractures. Up to 2 in 100 adolescents and 5 in 100 adults with pertussis are hospitalized or have complications, which could include pneumonia or death. These diseases are caused by bacteria. Diphtheria and pertussis are spread from person to person through coughing or sneezing. Tetanus enters the body through cuts, scratches, or wounds. Before vaccines, the [place] saw as many as 200,000 cases a year of diphtheria and pertussis, and hundreds of cases of tetanus. Since vaccination began, tetanus and diphtheria have dropped by about 99% and pertussis by about 80%. 2. Tdap vaccine Tdap vaccine can protect adolescents and adults from tetanus, diphtheria, and pertussis. One dose of Tdap is routinely given at age  44 or 2. People who did not get Tdap at that age should get it as soon as possible. Tdap is especially important for health care professionals and anyone having close contact with a baby younger than 12 months. Pregnant women should get a dose of Tdap during every pregnancy, to protect the newborn from pertussis. Infants are most at risk for severe, life-threatening complications from pertussis. A similar vaccine, called Td, protects from tetanus and diphtheria, but not pertussis. A Td booster should be given every 10 years. Tdap may be given as one of these boosters if you have not already gotten a dose. Tdap may also be given after a severe cut or burn to prevent tetanus infection. Your doctor can give you more information. Tdap may safely be given at the same time as other vaccines. 3. Some people should not get this vaccine  If you ever had a life-threatening allergic reaction after a dose of any tetanus, diphtheria, or pertussis containing vaccine, OR if you have a severe allergy to any part of this vaccine, you should not get Tdap. Tell your doctor if you have any severe allergies.  If you had a coma, or long or multiple seizures within 7 days after a childhood dose of DTP or DTaP, you should not get Tdap, unless a cause other than the vaccine was found. You can still get Td.  Talk to your doctor if you:  have epilepsy or another nervous system problem,  had severe pain or swelling after any vaccine containing diphtheria, tetanus or pertussis,  ever had Guillain-Barr Syndrome (GBS),  aren't feeling well on the day the shot is scheduled. 4. Risks of a vaccine reaction With any medicine, including vaccines, there is a chance of side effects. These are usually mild and go away on their own, but serious reactions are also possible. Brief fainting spells can follow a vaccination, leading to injuries from falling. Sitting or lying down for about 15 minutes can help prevent these. Tell your  doctor if you feel dizzy or light-headed, or have vision changes or ringing in the ears. Mild problems following Tdap (Did not interfere with activities)  Pain where the shot was given (about 3 in 4 adolescents or 2 in 3 adults)  Redness or swelling where the shot was given (about 1 person in 5)  Mild fever of at least 100.71F (up to about 1 in 25 adolescents or 1 in 100 adults)  Headache (about 3 or 4 people in 10)  Tiredness (about 1 person in 3 or 4)  Nausea, vomiting, diarrhea, stomach ache (up to 1 in 4 adolescents or 1 in 10 adults)  Chills, body aches, sore joints,  rash, swollen glands (uncommon) Moderate problems following Tdap (Interfered with activities, but did not require medical attention)  Pain where the shot was given (about 1 in 5 adolescents or 1 in 100 adults)  Redness or swelling where the shot was given (up to about 1 in 16 adolescents or 1 in 25 adults)  Fever over 102F (about 1 in 100 adolescents or 1 in 250 adults)  Headache (about 3 in 20 adolescents or 1 in 10 adults)  Nausea, vomiting, diarrhea, stomach ache (up to 1 or 3 people in 100)  Swelling of the entire arm where the shot was given (up to about 3 in 100). Severe problems following Tdap (Unable to perform usual activities; required medical attention)  Swelling, severe pain, bleeding and redness in the arm where the shot was given (rare). A severe allergic reaction could occur after any vaccine (estimated less than 1 in a million doses). 5. What if there is a serious reaction? What should I look for?  Look for anything that concerns you, such as signs of a severe allergic reaction, very high fever, or behavior changes. Signs of a severe allergic reaction can include hives, swelling of the face and throat, difficulty breathing, a fast heartbeat, dizziness, and weakness. These would start a few minutes to a few hours after the vaccination. What should I do?  If you think it is a severe allergic  reaction or other emergency that can't wait, call 9-1-1 or get the person to the nearest hospital. Otherwise, call your doctor.  Afterward, the reaction should be reported to the "Vaccine Adverse Event Reporting System" (VAERS). Your doctor might file this report, or you can do it yourself through the VAERS web site at www.vaers.LAgents.no, or by calling 1-952-262-6894. VAERS is only for reporting reactions. They do not give medical advice.  6. The National Vaccine Injury Compensation Program The [employer] Vaccine Injury Compensation Program (VICP) is a federal program that was created to compensate people who may have been injured by certain vaccines. Persons who believe they may have been injured by a vaccine can learn about the program and about filing a claim by calling 1-(229) 206-0454 or visiting the VICP website at SpiritualWord.at. 7. How can I learn more?  Ask your doctor.  Call your local or state health department.  Contact the Centers for Disease Control and Prevention (CDC):  Call 419 729 6307 or visit CDC's website at PicCapture.uy. CDC Tdap Vaccine VIS (01/28/12) Document Released: 03/08/2012 Document Revised: 01/22/2014 Document Reviewed: 12/20/2013 [employer] Patient Information 2015 [place], [place]. This information is not intended to replace advice given to you by your health care provider. Make sure you discuss any questions you have with your health care provider.

## 2015-06-15 NOTE — Progress Notes (Signed)
Subjective:  Drina Jobst is a 29 y.o. G1P0000 at [redacted]w[redacted]d being seen today for ongoing prenatal care.  Patient reports no complaints.  Contractions: Not present.  Vag. Bleeding: None. Movement: Present. Denies leaking of fluid.   The following portions of the patient's history were reviewed and updated as appropriate: allergies, current medications, past family history, past medical history, past social history, past surgical history and problem list.   Objective:   Filed Vitals:   06/10/15 0830  BP: 123/71  Pulse: 88  Weight: 182 lb (82.555 kg)    Fetal Status: Fetal Heart Rate (bpm): 147 Fundal Height: 25 cm Movement: Present     General:  Alert, oriented and cooperative. Patient is in no acute distress.  Skin: Skin is warm and dry. No rash noted.   Cardiovascular: Normal heart rate noted  Respiratory: Normal respiratory effort, no problems with respiration noted  Abdomen: Soft, gravid, appropriate for gestational age. Pain/Pressure: Absent     Pelvic: Vag. Bleeding: None Vag D/C Character: Thin   Cervical exam deferred        Extremities: Normal range of motion.  Edema: None  Mental Status: Normal mood and affect. Normal behavior. Normal judgment and thought content.   Urinalysis: Urine Protein: Trace Urine Glucose: Negative  Assessment and Plan:  Pregnancy: G1P0000 at [redacted]w[redacted]d  1. Supervision of other high-risk pregnancy in first trimester   2. Systemic lupus erythematosus affecting pregnancy in first trimester  -Due to increased risk of preeclampsia in patient's with lupus I recommend baby aspirin. Patient uncertain. Would like to discuss with MFM at next ultrasound.  Preterm labor symptoms and general obstetric precautions including but not limited to vaginal bleeding, contractions, leaking of fluid and fetal movement were reviewed in detail with the patient. Please refer to After Visit Summary for other counseling recommendations.  Return in about 3 weeks (around  07/01/2015).   Dorathy Kinsman, CNM

## 2015-07-05 ENCOUNTER — Ambulatory Visit (HOSPITAL_COMMUNITY)
Admission: RE | Admit: 2015-07-05 | Discharge: 2015-07-05 | Disposition: A | Discharge status: Home / Self Care | Payer: 59 | Source: Ambulatory Visit | Attending: Family Medicine | Admitting: Family Medicine

## 2015-07-05 VITALS — BP 111/55 | HR 72 | Wt 185.5 lb

## 2015-07-05 DIAGNOSIS — M329 Systemic lupus erythematosus, unspecified: Secondary | ICD-10-CM

## 2015-07-05 DIAGNOSIS — O99280 Endocrine, nutritional and metabolic diseases complicating pregnancy, unspecified trimester: Secondary | ICD-10-CM

## 2015-07-05 DIAGNOSIS — Z3A28 28 weeks gestation of pregnancy: Secondary | ICD-10-CM | POA: Diagnosis not present

## 2015-07-05 DIAGNOSIS — Z3491 Encounter for supervision of normal pregnancy, unspecified, first trimester: Secondary | ICD-10-CM

## 2015-07-05 DIAGNOSIS — E039 Hypothyroidism, unspecified: Secondary | ICD-10-CM

## 2015-07-05 DIAGNOSIS — O26899 Other specified pregnancy related conditions, unspecified trimester: Secondary | ICD-10-CM

## 2015-07-05 DIAGNOSIS — O26892 Other specified pregnancy related conditions, second trimester: Secondary | ICD-10-CM | POA: Diagnosis present

## 2015-07-05 DIAGNOSIS — O99282 Endocrine, nutritional and metabolic diseases complicating pregnancy, second trimester: Secondary | ICD-10-CM | POA: Insufficient documentation

## 2015-07-05 DIAGNOSIS — O9989 Other specified diseases and conditions complicating pregnancy, childbirth and the puerperium: Secondary | ICD-10-CM

## 2015-07-08 ENCOUNTER — Ambulatory Visit (INDEPENDENT_AMBULATORY_CARE_PROVIDER_SITE_OTHER): Payer: 59 | Admitting: Certified Nurse Midwife

## 2015-07-08 VITALS — BP 119/65 | HR 78 | Wt 185.0 lb

## 2015-07-08 DIAGNOSIS — Z3492 Encounter for supervision of normal pregnancy, unspecified, second trimester: Secondary | ICD-10-CM

## 2015-07-08 DIAGNOSIS — Z3403 Encounter for supervision of normal first pregnancy, third trimester: Secondary | ICD-10-CM | POA: Diagnosis not present

## 2015-07-08 DIAGNOSIS — O99891 Other specified diseases and conditions complicating pregnancy: Secondary | ICD-10-CM

## 2015-07-08 DIAGNOSIS — O26899 Other specified pregnancy related conditions, unspecified trimester: Secondary | ICD-10-CM

## 2015-07-08 DIAGNOSIS — O9989 Other specified diseases and conditions complicating pregnancy, childbirth and the puerperium: Secondary | ICD-10-CM

## 2015-07-08 DIAGNOSIS — M329 Systemic lupus erythematosus, unspecified: Secondary | ICD-10-CM

## 2015-07-08 NOTE — Progress Notes (Signed)
Subjective:  Melissa Burch is a 29 y.o. G1P0000 at 2882w6d being seen today for ongoing prenatal care.  Patient reports no complaints.  Contractions: Not present.  Vag. Bleeding: None. Movement: Present. Denies leaking of fluid.   The following portions of the patient's history were reviewed and updated as appropriate: allergies, current medications, past family history, past medical history, past social history, past surgical history and problem list. Problem list updated.  Objective:   Filed Vitals:   07/08/15 0916  BP: 119/65  Pulse: 78  Weight: 185 lb (83.915 kg)    Fetal Status: Fetal Heart Rate (bpm): 147 Fundal Height: 28 cm Movement: Present     General:  Alert, oriented and cooperative. Patient is in no acute distress.  Skin: Skin is warm and dry. No rash noted.   Cardiovascular: Normal heart rate noted  Respiratory: Normal respiratory effort, no problems with respiration noted  Abdomen: Soft, gravid, appropriate for gestational age. Pain/Pressure: Absent     Pelvic: Vag. Bleeding: None Vag D/C Character: Thin   Cervical exam deferred        Extremities: Normal range of motion.  Edema: None  Mental Status: Normal mood and affect. Normal behavior. Normal judgment and thought content.   Urinalysis: Urine Protein: Trace Urine Glucose: Negative  Assessment and Plan:  Pregnancy: G1P0000 at 9582w6d  1. Supervision of normal pregnancy, second trimester 2. Lupus - Glucose Tolerance, 1 HR (50g) - CBC - HIV antibody - RPR - Sjogrens syndrome-A extractable nuclear antibody - Sjogrens syndrome-B extractable nuclear antibody  Preterm labor symptoms and general obstetric precautions including but not limited to vaginal bleeding, contractions, leaking of fluid and fetal movement were reviewed in detail with the patient. Please refer to After Visit Summary for other counseling recommendations. Spoke to Dr. Marjo Bickerenney regarding ASA and Blood work Community education officerreccomendations. Too late for ASA in this  pregnancy. If antibodies positive on bloodwork-will need to schedule fetal echocardiogram No Follow-up on file. F/U 2 weeks  Rhea PinkLori A Morris Markham, CNM

## 2015-07-08 NOTE — Patient Instructions (Signed)

## 2015-07-09 ENCOUNTER — Telehealth: Payer: Self-pay | Admitting: *Deleted

## 2015-07-09 LAB — HIV ANTIBODY (ROUTINE TESTING W REFLEX): HIV 1&2 Ab, 4th Generation: NONREACTIVE

## 2015-07-09 LAB — SJOGRENS SYNDROME-A EXTRACTABLE NUCLEAR ANTIBODY: SSA (Ro) (ENA) Antibody, IgG: <1

## 2015-07-09 LAB — CBC
HCT: 32.8 % — ABNORMAL LOW (ref 36.0–46.0)
Hemoglobin: 10.9 g/dL — ABNORMAL LOW (ref 12.0–15.0)
MCH: 31.8 pg (ref 26.0–34.0)
MCHC: 33.2 g/dL (ref 30.0–36.0)
MCV: 95.6 fL (ref 78.0–100.0)
MPV: 9 fL (ref 8.6–12.4)
Platelets: 197 10*3/uL (ref 150–400)
RBC: 3.43 MIL/uL — ABNORMAL LOW (ref 3.87–5.11)
RDW: 13.2 % (ref 11.5–15.5)
WBC: 7.3 10*3/uL (ref 4.0–10.5)

## 2015-07-09 LAB — RPR

## 2015-07-09 LAB — GLUCOSE TOLERANCE, 1 HOUR (50G) W/O FASTING: Glucose, 1 Hour GTT: 92 mg/dL (ref 70–140)

## 2015-07-09 LAB — SJOGRENS SYNDROME-B EXTRACTABLE NUCLEAR ANTIBODY: SSB (La) (ENA) Antibody, IgG: <1

## 2015-07-09 NOTE — Telephone Encounter (Signed)
Pt notified of 28 week labs and normal 1 hr GTT and Sjogren's antibodies were neg.

## 2015-07-22 ENCOUNTER — Ambulatory Visit (INDEPENDENT_AMBULATORY_CARE_PROVIDER_SITE_OTHER): Payer: 59 | Admitting: Family

## 2015-07-22 VITALS — BP 119/65 | HR 86 | Wt 188.0 lb

## 2015-07-22 DIAGNOSIS — Z3491 Encounter for supervision of normal pregnancy, unspecified, first trimester: Secondary | ICD-10-CM

## 2015-07-22 DIAGNOSIS — E039 Hypothyroidism, unspecified: Secondary | ICD-10-CM

## 2015-07-22 DIAGNOSIS — O26891 Other specified pregnancy related conditions, first trimester: Secondary | ICD-10-CM

## 2015-07-22 DIAGNOSIS — O9989 Other specified diseases and conditions complicating pregnancy, childbirth and the puerperium: Secondary | ICD-10-CM

## 2015-07-22 DIAGNOSIS — Z3481 Encounter for supervision of other normal pregnancy, first trimester: Secondary | ICD-10-CM

## 2015-07-22 DIAGNOSIS — M329 Systemic lupus erythematosus, unspecified: Secondary | ICD-10-CM

## 2015-07-22 LAB — TSH: TSH: 1.801 u[IU]/mL (ref 0.350–4.500)

## 2015-07-22 NOTE — Progress Notes (Signed)
Subjective:  Melissa Burch is a 29 y.o. G1P0000 at 4826w6d being seen today for ongoing prenatal care.  Patient reports occasional Braxton Hick's. Rheumatologist did not draw TSH at last visit.  Contractions: Not present.  Vag. Bleeding: None. Movement: Present. Denies leaking of fluid.   The following portions of the patient's history were reviewed and updated as appropriate: allergies, current medications, past family history, past medical history, past social history, past surgical history and problem list. Problem list updated.  Objective:   Filed Vitals:   07/22/15 0903  BP: 119/65  Pulse: 86  Weight: 188 lb (85.276 kg)    Fetal Status: Fetal Heart Rate (bpm): 148   Movement: Present     General:  Alert, oriented and cooperative. Patient is in no acute distress.  Skin: Skin is warm and dry. No rash noted.   Cardiovascular: Normal heart rate noted  Respiratory: Normal respiratory effort, no problems with respiration noted  Abdomen: Soft, gravid, appropriate for gestational age. Pain/Pressure: Absent     Pelvic: Vag. Bleeding: None Vag D/C Character: Thin   Cervical exam deferred        Extremities: Normal range of motion.     Mental Status: Normal mood and affect. Normal behavior. Normal judgment and thought content.   Urinalysis: Urine Protein: Trace Urine Glucose: Negative  Assessment and Plan:  Pregnancy: G1P0000 at 6026w6d  1. Supervision of normal pregnancy in first trimester - Reviewed third trimester labs  2. Systemic lupus erythematosus affecting pregnancy in first trimester [facility](HCC) - Continued visit with rheumatologist - Begin fetal testing next week - Ultrasound scheduled for two weeks from now  3. Hypothyroidism, unspecified hypothyroidism type - TSH  Preterm labor symptoms and general obstetric precautions including but not limited to vaginal bleeding, contractions, leaking of fluid and fetal movement were reviewed in detail with the patient. Please refer to  After Visit Summary for other counseling recommendations.  Return in about 8 days (around 07/30/2015) for Tue/Friday NST; appt in two weeks .   Eino FarberWalidah Kennith GainN Burch, CNM

## 2015-07-23 ENCOUNTER — Telehealth: Payer: Self-pay | Admitting: *Deleted

## 2015-07-23 NOTE — Telephone Encounter (Signed)
Lm on voicemail of normal TSH

## 2015-07-29 ENCOUNTER — Ambulatory Visit (INDEPENDENT_AMBULATORY_CARE_PROVIDER_SITE_OTHER): Payer: 59 | Admitting: *Deleted

## 2015-07-29 VITALS — BP 103/65 | HR 100 | Wt 188.0 lb

## 2015-07-29 DIAGNOSIS — M329 Systemic lupus erythematosus, unspecified: Secondary | ICD-10-CM

## 2015-07-29 DIAGNOSIS — O9989 Other specified diseases and conditions complicating pregnancy, childbirth and the puerperium: Principal | ICD-10-CM

## 2015-07-29 DIAGNOSIS — O26893 Other specified pregnancy related conditions, third trimester: Secondary | ICD-10-CM | POA: Diagnosis not present

## 2015-07-29 DIAGNOSIS — O99891 Other specified diseases and conditions complicating pregnancy: Secondary | ICD-10-CM

## 2015-07-29 NOTE — Progress Notes (Signed)
Pt here for antenatal testing due to Lupus Dx.

## 2015-08-01 ENCOUNTER — Ambulatory Visit (INDEPENDENT_AMBULATORY_CARE_PROVIDER_SITE_OTHER): Payer: 59 | Admitting: Obstetrics & Gynecology

## 2015-08-01 VITALS — BP 107/63 | HR 86 | Wt 187.0 lb

## 2015-08-01 DIAGNOSIS — Z3A32 32 weeks gestation of pregnancy: Secondary | ICD-10-CM | POA: Diagnosis not present

## 2015-08-01 DIAGNOSIS — O26893 Other specified pregnancy related conditions, third trimester: Secondary | ICD-10-CM | POA: Diagnosis not present

## 2015-08-01 DIAGNOSIS — O9989 Other specified diseases and conditions complicating pregnancy, childbirth and the puerperium: Principal | ICD-10-CM

## 2015-08-01 DIAGNOSIS — M329 Systemic lupus erythematosus, unspecified: Secondary | ICD-10-CM

## 2015-08-01 DIAGNOSIS — O99891 Other specified diseases and conditions complicating pregnancy: Secondary | ICD-10-CM

## 2015-08-01 NOTE — Progress Notes (Signed)
Antenatal testing for Dx Lupus. Armandina StammerJennifer Stellan Vick RN BSN

## 2015-08-05 ENCOUNTER — Ambulatory Visit (HOSPITAL_COMMUNITY): Payer: 59

## 2015-08-06 ENCOUNTER — Ambulatory Visit (INDEPENDENT_AMBULATORY_CARE_PROVIDER_SITE_OTHER): Payer: 59 | Admitting: Obstetrics & Gynecology

## 2015-08-06 VITALS — BP 105/59 | HR 79

## 2015-08-06 DIAGNOSIS — Z23 Encounter for immunization: Secondary | ICD-10-CM | POA: Diagnosis not present

## 2015-08-06 DIAGNOSIS — O26891 Other specified pregnancy related conditions, first trimester: Secondary | ICD-10-CM

## 2015-08-06 DIAGNOSIS — M329 Systemic lupus erythematosus, unspecified: Secondary | ICD-10-CM

## 2015-08-06 DIAGNOSIS — O99891 Other specified diseases and conditions complicating pregnancy: Secondary | ICD-10-CM

## 2015-08-06 DIAGNOSIS — E039 Hypothyroidism, unspecified: Secondary | ICD-10-CM

## 2015-08-06 DIAGNOSIS — O9989 Other specified diseases and conditions complicating pregnancy, childbirth and the puerperium: Principal | ICD-10-CM

## 2015-08-06 DIAGNOSIS — Z3491 Encounter for supervision of normal pregnancy, unspecified, first trimester: Secondary | ICD-10-CM

## 2015-08-06 DIAGNOSIS — Z3481 Encounter for supervision of other normal pregnancy, first trimester: Secondary | ICD-10-CM

## 2015-08-06 NOTE — Progress Notes (Signed)
Subjective:  Melissa Burch is a 29 y.o. G1P0000 at 1587w0d being seen today for ongoing prenatal care.  Patient reports no complaints.  Contractions: Irregular.  Vag. Bleeding: None. Movement: Present. Denies leaking of fluid.   The following portions of the patient's history were reviewed and updated as appropriate: allergies, current medications, past family history, past medical history, past social history, past surgical history and problem list. Problem list updated.  Objective:   Filed Vitals:   08/06/15 1502  BP: 105/59  Pulse: 79    Fetal Status: Fetal Heart Rate (bpm): NST   Movement: Present     General:  Alert, oriented and cooperative. Patient is in no acute distress.  Skin: Skin is warm and dry. No rash noted.   Cardiovascular: Normal heart rate noted  Respiratory: Normal respiratory effort, no problems with respiration noted  Abdomen: Soft, gravid, appropriate for gestational age. Pain/Pressure: Absent     Pelvic: Vag. Bleeding: None Vag D/C Character: Thin   Cervical exam performed      closed/thick/high  Extremities: Normal range of motion.  Edema: None  Mental Status: Normal mood and affect. Normal behavior. Normal judgment and thought content.   Urinalysis: Urine Protein: Trace Urine Glucose: Negative  Assessment and Plan:  Pregnancy: G1P0000 at 4787w0d  1. Systemic lupus erythematosus affecting pregnancy in first trimester (HCC)  - Flu Vaccine QUAD 36+ mos IM (Fluarix & Fluzone Quad PF  2. Supervision of normal pregnancy in first trimester   3. Hypothyroidism, unspecified hypothyroidism type   Preterm labor symptoms and general obstetric precautions including but not limited to vaginal bleeding, contractions, leaking of fluid and fetal movement were reviewed in detail with the patient. Please refer to After Visit Summary for other counseling recommendations.  Return in about 1 week (around 08/13/2015) for continue testing.   Allie BossierMyra C Kadden Osterhout, MD

## 2015-08-09 ENCOUNTER — Encounter (HOSPITAL_COMMUNITY): Payer: Self-pay

## 2015-08-09 ENCOUNTER — Ambulatory Visit (HOSPITAL_COMMUNITY)
Admission: RE | Admit: 2015-08-09 | Discharge: 2015-08-09 | Disposition: A | Discharge status: Home / Self Care | Payer: 59 | Source: Ambulatory Visit | Attending: Family Medicine | Admitting: Family Medicine

## 2015-08-09 ENCOUNTER — Other Ambulatory Visit (HOSPITAL_COMMUNITY): Payer: Self-pay | Admitting: Maternal and Fetal Medicine

## 2015-08-09 ENCOUNTER — Ambulatory Visit (HOSPITAL_COMMUNITY)
Admission: RE | Admit: 2015-08-09 | Discharge: 2015-08-09 | Disposition: A | Discharge status: Home / Self Care | Payer: 59 | Source: Ambulatory Visit | Attending: Obstetrics and Gynecology | Admitting: Obstetrics and Gynecology

## 2015-08-09 DIAGNOSIS — Z3A33 33 weeks gestation of pregnancy: Secondary | ICD-10-CM | POA: Insufficient documentation

## 2015-08-09 DIAGNOSIS — O26893 Other specified pregnancy related conditions, third trimester: Secondary | ICD-10-CM | POA: Insufficient documentation

## 2015-08-09 DIAGNOSIS — M329 Systemic lupus erythematosus, unspecified: Secondary | ICD-10-CM | POA: Diagnosis not present

## 2015-08-09 DIAGNOSIS — O99283 Endocrine, nutritional and metabolic diseases complicating pregnancy, third trimester: Secondary | ICD-10-CM

## 2015-08-09 DIAGNOSIS — O9989 Other specified diseases and conditions complicating pregnancy, childbirth and the puerperium: Secondary | ICD-10-CM

## 2015-08-09 DIAGNOSIS — E039 Hypothyroidism, unspecified: Secondary | ICD-10-CM

## 2015-08-09 DIAGNOSIS — O99891 Other specified diseases and conditions complicating pregnancy: Secondary | ICD-10-CM

## 2015-08-13 ENCOUNTER — Ambulatory Visit (INDEPENDENT_AMBULATORY_CARE_PROVIDER_SITE_OTHER): Payer: 59 | Admitting: Obstetrics and Gynecology

## 2015-08-13 ENCOUNTER — Encounter: Payer: Self-pay | Admitting: Obstetrics and Gynecology

## 2015-08-13 VITALS — BP 114/59 | HR 79

## 2015-08-13 DIAGNOSIS — O26893 Other specified pregnancy related conditions, third trimester: Secondary | ICD-10-CM

## 2015-08-13 DIAGNOSIS — Z3403 Encounter for supervision of normal first pregnancy, third trimester: Secondary | ICD-10-CM

## 2015-08-13 DIAGNOSIS — M329 Systemic lupus erythematosus, unspecified: Secondary | ICD-10-CM

## 2015-08-13 DIAGNOSIS — Z3491 Encounter for supervision of normal pregnancy, unspecified, first trimester: Secondary | ICD-10-CM

## 2015-08-13 DIAGNOSIS — IMO0002 Reserved for concepts with insufficient information to code with codable children: Secondary | ICD-10-CM

## 2015-08-13 DIAGNOSIS — O9989 Other specified diseases and conditions complicating pregnancy, childbirth and the puerperium: Secondary | ICD-10-CM

## 2015-08-13 DIAGNOSIS — E039 Hypothyroidism, unspecified: Secondary | ICD-10-CM

## 2015-08-13 NOTE — Progress Notes (Signed)
Subjective:  Melissa HatchetMaggie Aronoff is a 29 y.o. G1P0000 at 4046w0d being seen today for ongoing prenatal care.  She is currently monitored for the following issues for this high-risk pregnancy: Patient Active Problem List   Diagnosis Date Noted  . Systemic lupus erythematosus affecting pregnancy in first trimester (HCC)   . Supervision of normal pregnancy in first trimester 03/11/2015  . Pure hypercholesterolemia 07/25/2013  . GERD (gastroesophageal reflux disease) 07/25/2013  . IBS (irritable bowel syndrome) 07/25/2013  . Lupus (HCC) 07/24/2013  . Hypothyroidism 07/24/2013   Patient reports no complaints.  Contractions: Irregular. Vag. Bleeding: None.  Movement: Present. Denies leaking of fluid.   The following portions of the patient's history were reviewed and updated as appropriate: allergies, current medications, past family history, past medical history, past social history, past surgical history and problem list. Problem list updated.  Objective:   Filed Vitals:   08/13/15 1540  BP: 114/59  Pulse: 79    Fetal Status: Fetal Heart Rate (bpm): NST Fundal Height: 35 cm Movement: Present     General:  Alert, oriented and cooperative. Patient is in no acute distress.  Skin: Skin is warm and dry. No rash noted.   Cardiovascular: Normal heart rate noted  Respiratory: Normal respiratory effort, no problems with respiration noted  Abdomen: Soft, gravid, appropriate for gestational age. Pain/Pressure: Present     Pelvic: Vag. Bleeding: None Vag D/C Character: Thin   Cervical exam deferred        Extremities: Normal range of motion.  Edema: None  Mental Status: Normal mood and affect. Normal behavior. Normal judgment and thought content.   Urinalysis: Urine Protein: Trace Urine Glucose: Negative  Assessment and Plan:  Pregnancy: G1P0000 at 2646w0d  1. Supervision of normal pregnancy in first trimester Patient is doing well. Unaware of her 50 % of her contractions. Advised to stay well  hydrated  2. Systemic lupus erythematosus affecting pregnancy in first trimester (HCC) No recent flares  3. Hypothyroidism, unspecified hypothyroidism type Continue current synthroid dose  4. Lupus (HCC) NST reviewed and reactive 08/09/15 EFW 2620 gm (84%tile)  Preterm labor symptoms and general obstetric precautions including but not limited to vaginal bleeding, contractions, leaking of fluid and fetal movement were reviewed in detail with the patient. Please refer to After Visit Summary for other counseling recommendations.  Return in about 1 week (around 08/20/2015) for NST with ob f/u.   Catalina AntiguaPeggy Deforrest Bogle, MD

## 2015-08-16 ENCOUNTER — Ambulatory Visit (HOSPITAL_COMMUNITY)
Admission: RE | Admit: 2015-08-16 | Discharge: 2015-08-16 | Disposition: A | Discharge status: Home / Self Care | Payer: 59 | Source: Ambulatory Visit | Attending: Obstetrics and Gynecology | Admitting: Obstetrics and Gynecology

## 2015-08-16 ENCOUNTER — Encounter (HOSPITAL_COMMUNITY): Payer: Self-pay

## 2015-08-16 VITALS — BP 121/76 | HR 79

## 2015-08-16 DIAGNOSIS — O99283 Endocrine, nutritional and metabolic diseases complicating pregnancy, third trimester: Secondary | ICD-10-CM | POA: Diagnosis not present

## 2015-08-16 DIAGNOSIS — Z3A34 34 weeks gestation of pregnancy: Secondary | ICD-10-CM | POA: Insufficient documentation

## 2015-08-16 DIAGNOSIS — M329 Systemic lupus erythematosus, unspecified: Secondary | ICD-10-CM

## 2015-08-16 DIAGNOSIS — O9989 Other specified diseases and conditions complicating pregnancy, childbirth and the puerperium: Principal | ICD-10-CM

## 2015-08-16 DIAGNOSIS — O99891 Other specified diseases and conditions complicating pregnancy: Secondary | ICD-10-CM

## 2015-08-16 DIAGNOSIS — E039 Hypothyroidism, unspecified: Secondary | ICD-10-CM | POA: Insufficient documentation

## 2015-08-16 DIAGNOSIS — O26893 Other specified pregnancy related conditions, third trimester: Secondary | ICD-10-CM | POA: Diagnosis not present

## 2015-08-20 ENCOUNTER — Ambulatory Visit (INDEPENDENT_AMBULATORY_CARE_PROVIDER_SITE_OTHER): Payer: 59 | Admitting: *Deleted

## 2015-08-20 VITALS — Wt 191.0 lb

## 2015-08-20 DIAGNOSIS — O26893 Other specified pregnancy related conditions, third trimester: Secondary | ICD-10-CM | POA: Diagnosis not present

## 2015-08-20 DIAGNOSIS — O9989 Other specified diseases and conditions complicating pregnancy, childbirth and the puerperium: Principal | ICD-10-CM

## 2015-08-20 DIAGNOSIS — M329 Systemic lupus erythematosus, unspecified: Secondary | ICD-10-CM | POA: Diagnosis not present

## 2015-08-23 ENCOUNTER — Ambulatory Visit (INDEPENDENT_AMBULATORY_CARE_PROVIDER_SITE_OTHER): Payer: 59 | Admitting: *Deleted

## 2015-08-23 VITALS — BP 112/61 | HR 72

## 2015-08-23 DIAGNOSIS — O26893 Other specified pregnancy related conditions, third trimester: Secondary | ICD-10-CM

## 2015-08-23 DIAGNOSIS — Z3A35 35 weeks gestation of pregnancy: Secondary | ICD-10-CM | POA: Diagnosis not present

## 2015-08-23 DIAGNOSIS — M329 Systemic lupus erythematosus, unspecified: Secondary | ICD-10-CM | POA: Diagnosis not present

## 2015-08-23 DIAGNOSIS — O9989 Other specified diseases and conditions complicating pregnancy, childbirth and the puerperium: Principal | ICD-10-CM

## 2015-08-27 ENCOUNTER — Ambulatory Visit (INDEPENDENT_AMBULATORY_CARE_PROVIDER_SITE_OTHER): Payer: 59 | Admitting: Obstetrics & Gynecology

## 2015-08-27 VITALS — BP 113/62 | HR 78

## 2015-08-27 DIAGNOSIS — Z113 Encounter for screening for infections with a predominantly sexual mode of transmission: Secondary | ICD-10-CM | POA: Diagnosis not present

## 2015-08-27 DIAGNOSIS — O26893 Other specified pregnancy related conditions, third trimester: Secondary | ICD-10-CM

## 2015-08-27 DIAGNOSIS — Z36 Encounter for antenatal screening of mother: Secondary | ICD-10-CM

## 2015-08-27 DIAGNOSIS — O99891 Other specified diseases and conditions complicating pregnancy: Secondary | ICD-10-CM

## 2015-08-27 DIAGNOSIS — M329 Systemic lupus erythematosus, unspecified: Secondary | ICD-10-CM

## 2015-08-27 DIAGNOSIS — O9989 Other specified diseases and conditions complicating pregnancy, childbirth and the puerperium: Principal | ICD-10-CM

## 2015-08-27 NOTE — Addendum Note (Signed)
Addended by: Arne ClevelandHUTCHINSON, Caileb Rhue J on: 08/27/2015 04:15 PM   Modules accepted: Orders

## 2015-08-27 NOTE — Progress Notes (Signed)
Subjective:  Melissa HatchetMaggie Gergely is a 29 y.o. G1P0000 at 2346w0d being seen today for ongoing prenatal care.  She is currently monitored for the following issues for this high-risk pregnancy and has Lupus (HCC); Hypothyroidism; Pure hypercholesterolemia; GERD (gastroesophageal reflux disease); IBS (irritable bowel syndrome); Supervision of normal pregnancy in first trimester; and Systemic lupus erythematosus affecting pregnancy in first trimester [facility](HCC) on her problem list.  Patient reports no complaints.  Contractions: Irregular. Vag. Bleeding: None.  Movement: Present. Denies leaking of fluid.   The following portions of the patient's history were reviewed and updated as appropriate: allergies, current medications, past family history, past medical history, past social history, past surgical history and problem list. Problem list updated.  Objective:   Filed Vitals:   08/27/15 1539  BP: 113/62  Pulse: 78    Fetal Status: Fetal Heart Rate (bpm): NST Fundal Height: 37 cm Movement: Present  Presentation: Vertex  General:  Alert, oriented and cooperative. Patient is in no acute distress.  Skin: Skin is warm and dry. No rash noted.   Cardiovascular: Normal heart rate noted  Respiratory: Normal respiratory effort, no problems with respiration noted  Abdomen: Soft, gravid, appropriate for gestational age. Pain/Pressure: Present     Pelvic: Vag. Bleeding: None Vag D/C Character: Thin   Cervical exam performed Dilation: Closed Effacement (%): Thick Station: Ballotable  Extremities: Normal range of motion.  Edema: None  Mental Status: Normal mood and affect. Normal behavior. Normal judgment and thought content.   Urinalysis:      Assessment and Plan:  Pregnancy: G1P0000 at 8046w0d  1. Systemic lupus erythematosus affecting pregnancy (HCC)  - GC/Chlamydia probe amp ([place])not at [facility] - Culture, beta strep (group b only)  Term labor symptoms and general obstetric precautions including but not  limited to vaginal bleeding, contractions, leaking of fluid and fetal movement were reviewed in detail with the patient. Please refer to After Visit Summary for other counseling recommendations.  Return for twice weekly testing.   Allie BossierMyra C Pricilla Moehle, MD

## 2015-08-28 LAB — CULTURE, BETA STREP (GROUP B ONLY)

## 2015-08-29 LAB — GC/CHLAMYDIA PROBE AMP (~~LOC~~) NOT AT ARMC
CHLAMYDIA, DNA PROBE: NEGATIVE
NEISSERIA GONORRHEA: NEGATIVE

## 2015-08-30 ENCOUNTER — Ambulatory Visit (INDEPENDENT_AMBULATORY_CARE_PROVIDER_SITE_OTHER): Payer: 59 | Admitting: *Deleted

## 2015-08-30 VITALS — BP 107/61 | HR 84

## 2015-08-30 DIAGNOSIS — O26899 Other specified pregnancy related conditions, unspecified trimester: Secondary | ICD-10-CM

## 2015-08-30 DIAGNOSIS — M329 Systemic lupus erythematosus, unspecified: Secondary | ICD-10-CM

## 2015-08-30 DIAGNOSIS — O9989 Other specified diseases and conditions complicating pregnancy, childbirth and the puerperium: Principal | ICD-10-CM

## 2015-08-30 DIAGNOSIS — O99891 Other specified diseases and conditions complicating pregnancy: Secondary | ICD-10-CM

## 2015-09-03 ENCOUNTER — Ambulatory Visit (INDEPENDENT_AMBULATORY_CARE_PROVIDER_SITE_OTHER): Payer: 59 | Admitting: Obstetrics & Gynecology

## 2015-09-03 VITALS — BP 112/73 | HR 77 | Wt 192.0 lb

## 2015-09-03 DIAGNOSIS — Z3491 Encounter for supervision of normal pregnancy, unspecified, first trimester: Secondary | ICD-10-CM

## 2015-09-03 DIAGNOSIS — Z3481 Encounter for supervision of other normal pregnancy, first trimester: Secondary | ICD-10-CM

## 2015-09-03 DIAGNOSIS — O99891 Other specified diseases and conditions complicating pregnancy: Secondary | ICD-10-CM

## 2015-09-03 DIAGNOSIS — O9989 Other specified diseases and conditions complicating pregnancy, childbirth and the puerperium: Secondary | ICD-10-CM

## 2015-09-03 DIAGNOSIS — O26893 Other specified pregnancy related conditions, third trimester: Secondary | ICD-10-CM

## 2015-09-03 DIAGNOSIS — M329 Systemic lupus erythematosus, unspecified: Secondary | ICD-10-CM | POA: Diagnosis not present

## 2015-09-03 NOTE — Progress Notes (Signed)
Subjective:  Melissa Burch is a 29 y.o. G1P0000 at 8181w0d being seen today for ongoing prenatal care.  She is currently monitored for the following issues for this high-risk pregnancy and has Lupus (HCC); Hypothyroidism; Pure hypercholesterolemia; GERD (gastroesophageal reflux disease); IBS (irritable bowel syndrome); Supervision of normal pregnancy in first trimester; and Systemic lupus erythematosus affecting pregnancy in first trimester [facility](HCC) on her problem list.  Patient reports no complaints.  Contractions: Irregular. Vag. Bleeding: None.  Movement: Present. Denies leaking of fluid.   The following portions of the patient's history were reviewed and updated as appropriate: allergies, current medications, past family history, past medical history, past social history, past surgical history and problem list. Problem list updated.  Objective:   Filed Vitals:   09/03/15 1409  BP: 112/73  Pulse: 77  Weight: 192 lb (87.091 kg)    Fetal Status: Fetal Heart Rate (bpm): NST   Movement: Present     General:  Alert, oriented and cooperative. Patient is in no acute distress.  Skin: Skin is warm and dry. No rash noted.   Cardiovascular: Normal heart rate noted  Respiratory: Normal respiratory effort, no problems with respiration noted  Abdomen: Soft, gravid, appropriate for gestational age. Pain/Pressure: Present     Pelvic: Vag. Bleeding: None Vag D/C Character: Thin   Cervical exam deferred        Extremities: Normal range of motion.  Edema: None  Mental Status: Normal mood and affect. Normal behavior. Normal judgment and thought content.   Urinalysis: Urine Protein: Negative Urine Glucose: Negative  Assessment and Plan:  Pregnancy: G1P0000 at 9481w0d  1. Supervision of normal pregnancy in first trimester   2. Systemic lupus erythematosus affecting pregnancy in first trimester (HCC) - Continue twice weekly testing  Term labor symptoms and general obstetric precautions including but  not limited to vaginal bleeding, contractions, leaking of fluid and fetal movement were reviewed in detail with the patient. Please refer to After Visit Summary for other counseling recommendations.  Return in about 1 week (around 09/10/2015).   Allie BossierMyra C Nhyira Leano, MD

## 2015-09-06 ENCOUNTER — Ambulatory Visit (INDEPENDENT_AMBULATORY_CARE_PROVIDER_SITE_OTHER): Payer: 59 | Admitting: *Deleted

## 2015-09-06 VITALS — BP 118/72 | HR 75 | Wt 192.0 lb

## 2015-09-06 DIAGNOSIS — Z3A37 37 weeks gestation of pregnancy: Secondary | ICD-10-CM | POA: Diagnosis not present

## 2015-09-06 DIAGNOSIS — M329 Systemic lupus erythematosus, unspecified: Secondary | ICD-10-CM | POA: Diagnosis not present

## 2015-09-06 DIAGNOSIS — Z3493 Encounter for supervision of normal pregnancy, unspecified, third trimester: Secondary | ICD-10-CM

## 2015-09-06 LAB — OB RESULTS CONSOLE GBS: STREP GROUP B AG: POSITIVE

## 2015-09-10 ENCOUNTER — Ambulatory Visit (INDEPENDENT_AMBULATORY_CARE_PROVIDER_SITE_OTHER): Payer: 59 | Admitting: Obstetrics & Gynecology

## 2015-09-10 VITALS — BP 107/52 | HR 88 | Wt 192.0 lb

## 2015-09-10 DIAGNOSIS — O26893 Other specified pregnancy related conditions, third trimester: Secondary | ICD-10-CM | POA: Diagnosis not present

## 2015-09-10 DIAGNOSIS — M329 Systemic lupus erythematosus, unspecified: Secondary | ICD-10-CM | POA: Diagnosis not present

## 2015-09-10 DIAGNOSIS — O0993 Supervision of high risk pregnancy, unspecified, third trimester: Secondary | ICD-10-CM

## 2015-09-10 DIAGNOSIS — Z22330 Carrier of Group B streptococcus: Secondary | ICD-10-CM

## 2015-09-10 DIAGNOSIS — O99820 Streptococcus B carrier state complicating pregnancy: Secondary | ICD-10-CM | POA: Insufficient documentation

## 2015-09-10 DIAGNOSIS — O9989 Other specified diseases and conditions complicating pregnancy, childbirth and the puerperium: Principal | ICD-10-CM

## 2015-09-10 NOTE — Progress Notes (Addendum)
Subjective:  Melissa HatchetMaggie Burch is a 29 y.o. G1P0000 at 164w0d being seen today for ongoing prenatal care.  She is currently monitored for the following issues for this high-risk pregnancy and has Lupus (HCC); Hypothyroidism; Pure hypercholesterolemia; GERD (gastroesophageal reflux disease); IBS (irritable bowel syndrome); Supervision of high-risk pregnancy; Systemic lupus erythematosus (SLE) affecting pregnancy, antepartum (HCC); and Group B Streptococcus carrier, +RV culture, currently pregnant on her problem list.  Patient reports no complaints.  Contractions: Irregular. Vag. Bleeding: None.  Movement: Present. Denies leaking of fluid.   The following portions of the patient's history were reviewed and updated as appropriate: allergies, current medications, past family history, past medical history, past social history, past surgical history and problem list. Problem list updated.  Objective:   Filed Vitals:   09/10/15 1342  BP: 107/52  Pulse: 88  Weight: 192 lb (87.091 kg)    Fetal Status: Fetal Heart Rate (bpm): NST Fundal Height: 38 cm Movement: Present  Presentation: Vertex  General:  Alert, oriented and cooperative. Patient is in no acute distress.  Skin: Skin is warm and dry. No rash noted.   Cardiovascular: Normal heart rate noted  Respiratory: Normal respiratory effort, no problems with respiration noted  Abdomen: Soft, gravid, appropriate for gestational age. Pain/Pressure: Present     Pelvic: Vag. Bleeding: None Vag D/C Character: Thin   Cervical exam performed Dilation: 1 Effacement (%): 40 Station: -3  Extremities: Normal range of motion.  Edema: None  Mental Status: Normal mood and affect. Normal behavior. Normal judgment and thought content.   Urinalysis: Urine Protein: Negative Urine Glucose: Negative NST performed today was reviewed and was found to be reactive.    Assessment and Plan:  Pregnancy: G1P0000 at 664w0d  1. Systemic lupus erythematosus (SLE) affecting  pregnancy, antepartum (HCC) Needs growth scan this week.  Continue recommended antenatal testing and prenatal care. - US MFM OB FOLLOW UP; Future and BPP IOL scheduled 09/19/15 0630 at 7871w2d  2. Group B Streptococcus carrier, +RV culture, currently pregnant Needs intrapartum treatment  3. Supervision of high-risk pregnancy, third trimester Term labor symptoms and general obstetric precautions including but not limited to vaginal bleeding, contractions, leaking of fluid and fetal movement were reviewed in detail with the patient. Please refer to After Visit Summary for other counseling recommendations.  Return in about 1 week (around 09/17/2015) for OB Visit, NST.   Tereso NewcomerUgonna A Mariavictoria Nottingham, MD

## 2015-09-10 NOTE — Addendum Note (Signed)
Addended by: Jaynie CollinsANYANWU, Karrisa Didio A on: 09/10/2015 02:30 PM   Modules accepted: Orders

## 2015-09-10 NOTE — Patient Instructions (Signed)
Return to clinic for any obstetric concerns or go to MAU for evaluation  

## 2015-09-10 NOTE — Addendum Note (Signed)
Addended by: Jaynie CollinsANYANWU, Zayvien Canning A on: 09/10/2015 04:24 PM   Modules accepted: Orders

## 2015-09-12 ENCOUNTER — Other Ambulatory Visit: Payer: Self-pay | Admitting: Obstetrics & Gynecology

## 2015-09-12 ENCOUNTER — Ambulatory Visit (HOSPITAL_COMMUNITY)
Admission: RE | Admit: 2015-09-12 | Discharge: 2015-09-12 | Disposition: A | Discharge status: Home / Self Care | Payer: 59 | Source: Ambulatory Visit | Attending: Obstetrics & Gynecology | Admitting: Obstetrics & Gynecology

## 2015-09-12 DIAGNOSIS — Z3A38 38 weeks gestation of pregnancy: Secondary | ICD-10-CM | POA: Diagnosis not present

## 2015-09-12 DIAGNOSIS — O0993 Supervision of high risk pregnancy, unspecified, third trimester: Secondary | ICD-10-CM

## 2015-09-12 DIAGNOSIS — M329 Systemic lupus erythematosus, unspecified: Secondary | ICD-10-CM

## 2015-09-12 DIAGNOSIS — O99891 Other specified diseases and conditions complicating pregnancy: Secondary | ICD-10-CM

## 2015-09-12 DIAGNOSIS — E039 Hypothyroidism, unspecified: Secondary | ICD-10-CM | POA: Diagnosis not present

## 2015-09-12 DIAGNOSIS — O9989 Other specified diseases and conditions complicating pregnancy, childbirth and the puerperium: Secondary | ICD-10-CM

## 2015-09-12 DIAGNOSIS — O99283 Endocrine, nutritional and metabolic diseases complicating pregnancy, third trimester: Secondary | ICD-10-CM | POA: Diagnosis not present

## 2015-09-12 DIAGNOSIS — O26893 Other specified pregnancy related conditions, third trimester: Secondary | ICD-10-CM | POA: Diagnosis not present

## 2015-09-13 ENCOUNTER — Ambulatory Visit: Payer: 59 | Admitting: *Deleted

## 2015-09-13 VITALS — BP 110/66 | HR 83 | Wt 191.0 lb

## 2015-09-13 DIAGNOSIS — O9989 Other specified diseases and conditions complicating pregnancy, childbirth and the puerperium: Principal | ICD-10-CM

## 2015-09-13 DIAGNOSIS — M329 Systemic lupus erythematosus, unspecified: Secondary | ICD-10-CM

## 2015-09-17 ENCOUNTER — Encounter (HOSPITAL_COMMUNITY): Payer: Self-pay | Admitting: *Deleted

## 2015-09-17 ENCOUNTER — Telehealth (HOSPITAL_COMMUNITY): Payer: Self-pay | Admitting: *Deleted

## 2015-09-17 ENCOUNTER — Ambulatory Visit (INDEPENDENT_AMBULATORY_CARE_PROVIDER_SITE_OTHER): Payer: 59 | Admitting: Obstetrics & Gynecology

## 2015-09-17 VITALS — BP 119/70 | HR 90 | Wt 192.0 lb

## 2015-09-17 DIAGNOSIS — O9989 Other specified diseases and conditions complicating pregnancy, childbirth and the puerperium: Principal | ICD-10-CM

## 2015-09-17 DIAGNOSIS — M329 Systemic lupus erythematosus, unspecified: Secondary | ICD-10-CM

## 2015-09-17 DIAGNOSIS — O99891 Other specified diseases and conditions complicating pregnancy: Secondary | ICD-10-CM

## 2015-09-17 DIAGNOSIS — O26899 Other specified pregnancy related conditions, unspecified trimester: Secondary | ICD-10-CM

## 2015-09-17 NOTE — Telephone Encounter (Signed)
Preadmission screen  

## 2015-09-17 NOTE — Progress Notes (Signed)
Subjective:  Melissa Burch is a 29 y.o. G1P0000 at 467w0d being seen today for ongoing prenatal care.  She is currently monitored for the following issues for this high-risk pregnancy and has Lupus (HCC); Hypothyroidism; Pure hypercholesterolemia; GERD (gastroesophageal reflux disease); IBS (irritable bowel syndrome); Supervision of high-risk pregnancy; Systemic lupus erythematosus (SLE) affecting pregnancy, antepartum (HCC); and Group B Streptococcus carrier, +RV culture, currently pregnant on her problem list.  Patient reports no complaints.  Contractions: Irregular. Vag. Bleeding: None.  Movement: Present. Denies leaking of fluid.   The following portions of the patient's history were reviewed and updated as appropriate: allergies, current medications, past family history, past medical history, past social history, past surgical history and problem list. Problem list updated.  Objective:   Filed Vitals:   09/17/15 1529  BP: 119/70  Pulse: 90  Weight: 192 lb (87.091 kg)    Fetal Status:     Movement: Present     General:  Alert, oriented and cooperative. Patient is in no acute distress.  Skin: Skin is warm and dry. No rash noted.   Cardiovascular: Normal heart rate noted  Respiratory: Normal respiratory effort, no problems with respiration noted  Abdomen: Soft, gravid, appropriate for gestational age. Pain/Pressure: Present     Pelvic: Vag. Bleeding: None Vag D/C Character: Thin   Cervical exam performed        Extremities: Normal range of motion.  Edema: None  Mental Status: Normal mood and affect. Normal behavior. Normal judgment and thought content.   Urinalysis:      Assessment and Plan:  Pregnancy: G1P0000 at 7967w0d  There are no diagnoses linked to this encounter. Term labor symptoms and general obstetric precautions including but not limited to vaginal bleeding, contractions, leaking of fluid and fetal movement were reviewed in detail with the patient. Please refer to  After Visit Summary for other counseling recommendations.  Return in about 6 weeks (around 10/29/2015). for post partum appt.  No issues with Lupus currently.   Lesly DukesKelly H Dejanay Wamboldt, MD

## 2015-09-18 ENCOUNTER — Inpatient Hospital Stay (HOSPITAL_COMMUNITY): Payer: 59 | Admitting: Anesthesiology

## 2015-09-18 ENCOUNTER — Encounter (HOSPITAL_COMMUNITY): Payer: Self-pay

## 2015-09-18 ENCOUNTER — Inpatient Hospital Stay (HOSPITAL_COMMUNITY)
Admission: AD | Admit: 2015-09-18 | Discharge: 2015-09-21 | DRG: 775 | Disposition: A | Discharge status: Home / Self Care | Payer: 59 | Source: Ambulatory Visit | Attending: Obstetrics & Gynecology | Admitting: Obstetrics & Gynecology

## 2015-09-18 DIAGNOSIS — Z823 Family history of stroke: Secondary | ICD-10-CM | POA: Diagnosis not present

## 2015-09-18 DIAGNOSIS — Z3A39 39 weeks gestation of pregnancy: Secondary | ICD-10-CM

## 2015-09-18 DIAGNOSIS — O9989 Other specified diseases and conditions complicating pregnancy, childbirth and the puerperium: Secondary | ICD-10-CM

## 2015-09-18 DIAGNOSIS — Z833 Family history of diabetes mellitus: Secondary | ICD-10-CM | POA: Diagnosis not present

## 2015-09-18 DIAGNOSIS — O9912 Other diseases of the blood and blood-forming organs and certain disorders involving the immune mechanism complicating childbirth: Secondary | ICD-10-CM | POA: Diagnosis present

## 2015-09-18 DIAGNOSIS — M329 Systemic lupus erythematosus, unspecified: Secondary | ICD-10-CM | POA: Diagnosis present

## 2015-09-18 DIAGNOSIS — O99891 Other specified diseases and conditions complicating pregnancy: Secondary | ICD-10-CM

## 2015-09-18 DIAGNOSIS — Z8249 Family history of ischemic heart disease and other diseases of the circulatory system: Secondary | ICD-10-CM

## 2015-09-18 DIAGNOSIS — Z22330 Carrier of Group B streptococcus: Secondary | ICD-10-CM | POA: Diagnosis not present

## 2015-09-18 DIAGNOSIS — O4200 Premature rupture of membranes, onset of labor within 24 hours of rupture, unspecified weeks of gestation: Secondary | ICD-10-CM

## 2015-09-18 DIAGNOSIS — O4290 Premature rupture of membranes, unspecified as to length of time between rupture and onset of labor, unspecified weeks of gestation: Secondary | ICD-10-CM | POA: Diagnosis present

## 2015-09-18 DIAGNOSIS — E7800 Pure hypercholesterolemia, unspecified: Secondary | ICD-10-CM

## 2015-09-18 DIAGNOSIS — O0993 Supervision of high risk pregnancy, unspecified, third trimester: Secondary | ICD-10-CM

## 2015-09-18 DIAGNOSIS — E039 Hypothyroidism, unspecified: Secondary | ICD-10-CM | POA: Diagnosis present

## 2015-09-18 DIAGNOSIS — O99284 Endocrine, nutritional and metabolic diseases complicating childbirth: Secondary | ICD-10-CM | POA: Diagnosis present

## 2015-09-18 DIAGNOSIS — O4292 Full-term premature rupture of membranes, unspecified as to length of time between rupture and onset of labor: Secondary | ICD-10-CM | POA: Diagnosis present

## 2015-09-18 DIAGNOSIS — O99824 Streptococcus B carrier state complicating childbirth: Secondary | ICD-10-CM | POA: Diagnosis present

## 2015-09-18 DIAGNOSIS — K589 Irritable bowel syndrome without diarrhea: Secondary | ICD-10-CM

## 2015-09-18 DIAGNOSIS — O4202 Full-term premature rupture of membranes, onset of labor within 24 hours of rupture: Secondary | ICD-10-CM | POA: Diagnosis not present

## 2015-09-18 DIAGNOSIS — O99820 Streptococcus B carrier state complicating pregnancy: Secondary | ICD-10-CM

## 2015-09-18 DIAGNOSIS — E038 Other specified hypothyroidism: Secondary | ICD-10-CM

## 2015-09-18 DIAGNOSIS — O0990 Supervision of high risk pregnancy, unspecified, unspecified trimester: Secondary | ICD-10-CM

## 2015-09-18 DIAGNOSIS — K219 Gastro-esophageal reflux disease without esophagitis: Secondary | ICD-10-CM | POA: Diagnosis present

## 2015-09-18 DIAGNOSIS — O9962 Diseases of the digestive system complicating childbirth: Secondary | ICD-10-CM | POA: Diagnosis present

## 2015-09-18 DIAGNOSIS — Z8759 Personal history of other complications of pregnancy, childbirth and the puerperium: Secondary | ICD-10-CM

## 2015-09-18 LAB — CBC
HCT: 35.5 % — ABNORMAL LOW (ref 36.0–46.0)
Hemoglobin: 12.3 g/dL (ref 12.0–15.0)
MCH: 31.8 pg (ref 26.0–34.0)
MCHC: 34.6 g/dL (ref 30.0–36.0)
MCV: 91.7 fL (ref 78.0–100.0)
PLATELETS: 253 10*3/uL (ref 150–400)
RBC: 3.87 MIL/uL (ref 3.87–5.11)
RDW: 12.3 % (ref 11.5–15.5)
WBC: 9.5 10*3/uL (ref 4.0–10.5)

## 2015-09-18 LAB — TYPE AND SCREEN
ABO/RH(D): O POS
Antibody Screen: NEGATIVE

## 2015-09-18 MED ORDER — LIDOCAINE HCL (PF) 1 % IJ SOLN
30.0000 mL | INTRAMUSCULAR | Status: DC | PRN
  Filled 2015-09-18: qty 30

## 2015-09-18 MED ORDER — PENICILLIN G POTASSIUM 5000000 UNITS IJ SOLR
5.0000 10*6.[IU] | Freq: Once | INTRAVENOUS | Status: AC
  Administered 2015-09-18: 5 10*6.[IU] via INTRAVENOUS
  Filled 2015-09-18: qty 5

## 2015-09-18 MED ORDER — HYDROXYCHLOROQUINE SULFATE 200 MG PO TABS
200.0000 mg | ORAL_TABLET | Freq: Two times a day (BID) | ORAL | Status: DC
  Filled 2015-09-18 (×2): qty 1

## 2015-09-18 MED ORDER — ACETAMINOPHEN 325 MG PO TABS
650.0000 mg | ORAL_TABLET | ORAL | Status: DC | PRN
  Administered 2015-09-18 – 2015-09-19 (×2): 650 mg via ORAL
  Filled 2015-09-18 (×2): qty 2

## 2015-09-18 MED ORDER — PHENYLEPHRINE 40 MCG/ML (10ML) SYRINGE FOR IV PUSH (FOR BLOOD PRESSURE SUPPORT)
80.0000 ug | PREFILLED_SYRINGE | INTRAVENOUS | Status: DC | PRN
  Administered 2015-09-18: 80 ug via INTRAVENOUS
  Filled 2015-09-18: qty 20
  Filled 2015-09-18: qty 2

## 2015-09-18 MED ORDER — OXYCODONE-ACETAMINOPHEN 5-325 MG PO TABS: 2.0000 | ORAL_TABLET | ORAL | Status: DC | PRN

## 2015-09-18 MED ORDER — EPHEDRINE 5 MG/ML INJ
10.0000 mg | INTRAVENOUS | Status: DC | PRN
  Filled 2015-09-18: qty 2

## 2015-09-18 MED ORDER — ONDANSETRON HCL 4 MG/2ML IJ SOLN: 4.0000 mg | Freq: Four times a day (QID) | INTRAMUSCULAR | Status: DC | PRN

## 2015-09-18 MED ORDER — LACTATED RINGERS IV SOLN
INTRAVENOUS | Status: DC
  Administered 2015-09-18 – 2015-09-19 (×3): via INTRAVENOUS

## 2015-09-18 MED ORDER — FLEET ENEMA 7-19 GM/118ML RE ENEM: 1.0000 | ENEMA | RECTAL | Status: DC | PRN

## 2015-09-18 MED ORDER — OXYTOCIN 40 UNITS IN LACTATED RINGERS INFUSION - SIMPLE MED
62.5000 mL/h | INTRAVENOUS | Status: DC
  Filled 2015-09-18: qty 1000

## 2015-09-18 MED ORDER — DIPHENHYDRAMINE HCL 50 MG/ML IJ SOLN: 12.5000 mg | INTRAMUSCULAR | Status: DC | PRN

## 2015-09-18 MED ORDER — LEVOTHYROXINE SODIUM 150 MCG PO TABS
150.0000 ug | ORAL_TABLET | Freq: Every day | ORAL | Status: DC
  Filled 2015-09-18: qty 1

## 2015-09-18 MED ORDER — LACTATED RINGERS IV SOLN: 500.0000 mL | INTRAVENOUS | Status: DC | PRN

## 2015-09-18 MED ORDER — FENTANYL 2.5 MCG/ML BUPIVACAINE 1/10 % EPIDURAL INFUSION (WH - ANES)
14.0000 mL/h | INTRAMUSCULAR | Status: DC | PRN
  Administered 2015-09-18 – 2015-09-19 (×3): 14 mL/h via EPIDURAL
  Filled 2015-09-18 (×2): qty 125

## 2015-09-18 MED ORDER — OXYTOCIN BOLUS FROM INFUSION
500.0000 mL | INTRAVENOUS | Status: DC
  Administered 2015-09-19: 500 mL via INTRAVENOUS

## 2015-09-18 MED ORDER — PENICILLIN G POTASSIUM 5000000 UNITS IJ SOLR
2.5000 10*6.[IU] | INTRAVENOUS | Status: DC
  Administered 2015-09-18 – 2015-09-19 (×2): 2.5 10*6.[IU] via INTRAVENOUS
  Filled 2015-09-18 (×6): qty 2.5

## 2015-09-18 MED ORDER — LIDOCAINE HCL (PF) 1 % IJ SOLN
INTRAMUSCULAR | Status: DC | PRN
  Administered 2015-09-18 (×2): 4 mL via EPIDURAL

## 2015-09-18 MED ORDER — CITRIC ACID-SODIUM CITRATE 334-500 MG/5ML PO SOLN
30.0000 mL | ORAL | Status: DC | PRN
  Administered 2015-09-19: 30 mL via ORAL
  Filled 2015-09-18: qty 15

## 2015-09-18 MED ORDER — OXYCODONE-ACETAMINOPHEN 5-325 MG PO TABS: 1.0000 | ORAL_TABLET | ORAL | Status: DC | PRN

## 2015-09-18 NOTE — Anesthesia Procedure Notes (Signed)
Epidural Patient location during procedure: OB Start time: 09/18/2015 8:37 PM  Staffing Anesthesiologist: Mal AmabileFOSTER, Kaziah Krizek Performed by: anesthesiologist   Preanesthetic Checklist Completed: patient identified, site marked, surgical consent, pre-op evaluation, timeout performed, IV checked, risks and benefits discussed and monitors and equipment checked  Epidural Patient position: sitting Prep: site prepped and draped and DuraPrep Patient monitoring: continuous pulse ox and blood pressure Approach: midline Location: L3-L4 Injection technique: LOR air  Needle:  Needle type: Tuohy  Needle gauge: 17 G Needle length: 9 cm and 9 Needle insertion depth: 6 cm Catheter type: closed end flexible Catheter size: 19 Gauge Catheter at skin depth: 10 cm Test dose: negative and Other  Assessment Events: blood not aspirated, injection not painful, no injection resistance, negative IV test and no paresthesia  Additional Notes Patient identified. Risks and benefits discussed including failed block, incomplete  Pain control, post dural puncture headache, nerve damage, paralysis, blood pressure Changes, nausea, vomiting, reactions to medications-both toxic and allergic and post Partum back pain. All questions were answered. Patient expressed understanding and wished to proceed. Sterile technique was used throughout procedure. Epidural site was Dressed with sterile barrier dressing. No paresthesias, signs of intravascular injection Or signs of intrathecal spread were encountered.  Patient was more comfortable after the epidural was dosed. Please see RN's note for documentation of vital signs and FHR which are stable.

## 2015-09-18 NOTE — MAU Note (Signed)
Pt presents to MAU with complaints of rupture of membranes at 1030 this morning. Pt denies any vaginal bleeding

## 2015-09-18 NOTE — Anesthesia Preprocedure Evaluation (Signed)
Anesthesia Evaluation  Patient identified by MRN, date of birth, ID band Patient awake    Reviewed: Allergy & Precautions, Patient's Chart, lab work & pertinent test results  Airway Mallampati: II  TM Distance: >3 FB Neck ROM: Full    Dental no notable dental hx. (+) Teeth Intact   Pulmonary neg pulmonary ROS,    Pulmonary exam normal breath sounds clear to auscultation- rhonchi       Cardiovascular negative cardio ROS Normal cardiovascular exam Rhythm:Regular Rate:Normal     Neuro/Psych negative neurological ROS  negative psych ROS   GI/Hepatic Neg liver ROS, GERD  Medicated and Controlled,  Endo/Other  Hypothyroidism Obesity  Renal/GU negative Renal ROS  negative genitourinary   Musculoskeletal negative musculoskeletal ROS (+)   Abdominal (+) + obese,   Peds  Hematology SLE   Anesthesia Other Findings   Reproductive/Obstetrics (+) Pregnancy                             Anesthesia Physical Anesthesia Plan  ASA: II  Anesthesia Plan: Epidural   Post-op Pain Management:    Induction:   Airway Management Planned: Natural Airway  Additional Equipment:   Intra-op Plan:   Post-operative Plan:   Informed Consent: I have reviewed the patients History and Physical, chart, labs and discussed the procedure including the risks, benefits and alternatives for the proposed anesthesia with the patient or authorized representative who has indicated his/her understanding and acceptance.     Plan Discussed with: Anesthesiologist  Anesthesia Plan Comments:         Anesthesia Quick Evaluation

## 2015-09-18 NOTE — Progress Notes (Signed)
Melissa HatchetMaggie Burch is a 29 y.o. G1P0000 at 5561w1d  admitted for rupture of membranes  Subjective: Recently got epidural; now comfortable  Objective: BP 109/58 mmHg  Pulse 81  Temp(Src) 99 F (37.2 C) (Oral)  Resp 18  Ht 5\' 6"  (1.676 m)  Wt 87.091 kg (192 lb)  BMI 31.00 kg/m2  SpO2 100%  LMP 12/18/2014 (Exact Date)      FHT:  FHR: 170s bpm, variability: moderate,  accelerations:  Present,  decelerations:  Absent- tachy x 40 mins UC:   regular, every 2-3 minutes, spont SVE:   Dilation: 4 Effacement (%): 80 Station: -2 Exam by:: Herby AbrahamE. Siska, RN  Labs: Lab Results  Component Value Date   WBC 9.5 09/18/2015   HGB 12.3 09/18/2015   HCT 35.5* 09/18/2015   MCV 91.7 09/18/2015   PLT 253 09/18/2015    Assessment / Plan: IUP@term  SROM/early active labor  Will give Tylenol for fetal tachycardia; no mat temp yet Plan to check cx 3 hrs after last exam; begin Pit prn  SHAW, KIMBERLY CNM 09/18/2015, 10:50 PM

## 2015-09-18 NOTE — H&P (Signed)
OBSTETRIC ADMISSION HISTORY AND PHYSICAL  Melissa Burch is a 29 y.o. female G1P0000 with IUP at 5946w1d by L/7 presenting for ROM. She reports +FMs,  no VB, no blurry vision, headaches or peripheral edema, and RUQ pain.  She plans on breastfeeding. She request natural family planning for birth control.  Dating: By L/7--->  Estimated Date of Delivery: 09/24/15   Clinic [facility] Prenatal Labs  Dating LMP matched 7 week US Blood type: O/POS/-- (05/23 0903)   Genetic Screen 1 Screen: normal CF: Neg   AFP: Neg                        NIPS: Antibody:NEG (05/23 0903)  Anatomic US  Normal  >> repeat for growth in 6 weeks (Scheduled 10/14) Rubella: 5.52 (05/23 0903)  GTT Third trimester: 92 RPR: NON REAC (05/23 0903)   Flu vaccine 08/06/15 HBsAg: NEGATIVE (05/23 0903)   TDaP vaccine  08/06/15                                          HIV: NONREACTIVE (05/23 0903)   GBS Positive                                           GBS: POSITIVE   Baby Food Breast Pap: Neg 3/16  Contraception Exclusive breastfeeding/Rhythm method   Circumcision N/A - girl   Pediatrician    Support Person FOB- Josh   Baseline 24 hr protein = 140  Prenatal History/Complications:  Past Medical History: Past Medical History  Diagnosis Date  . Lupus (HCC)   . Thyroid disease   . Vitamin D deficiency     Past Surgical History: Past Surgical History  Procedure Laterality Date  . Wisdom tooth extraction      Obstetrical History: OB History    Gravida Para Term Preterm AB TAB SAB Ectopic Multiple Living   1 0 0 0 0 0 0 0 0 0       Social History: Social History   Social History  . Marital Status: Married    Spouse Name: N/A  . Number of Children: N/A  . Years of Education: N/A   Occupational History  . Nanny    Social History Main Topics  . Smoking status: Never Smoker   . Smokeless tobacco: Never Used  . Alcohol Use: Yes     Comment: not while pregnant  . Drug Use: No  . Sexual Activity:    Partners: Male    Comment: last sex May 14   Other Topics Concern  . None   Social History Narrative    Family History: Family History  Problem Relation Age of Onset  . Hyperlipidemia Mother   . Diabetes Father   . Heart attack Paternal Grandmother   . Stroke Paternal Grandmother   . Diabetes Paternal Grandfather     Allergies: No Known Allergies  Prescriptions prior to admission  Medication Sig Dispense Refill Last Dose  . cholecalciferol (VITAMIN D) 1000 UNITS tablet Take 1,000 Units by mouth daily.   Taking  . docusate sodium (COLACE) 100 MG capsule Take 100 mg by mouth 2 (two) times daily.   Taking  . Ginger 500 MG CAPS Take 1 capsule by mouth as needed (nausea).  Taking  . hydroxychloroquine (PLAQUENIL) 200 MG tablet Take 200 mg by mouth 2 (two) times daily. Reported on 09/13/2015   Taking  . levothyroxine (SYNTHROID) 150 MCG tablet Take 1 tablet (150 mcg total) by mouth daily before breakfast. 30 tablet 5 Taking  . Prenatal Vit-Fe Fumarate-FA (PRENATAL MULTIVITAMIN) TABS tablet Take 1 tablet by mouth daily at 12 noon.   Taking  . Probiotic Product (PROBIOTIC DAILY PO) Take 1 capsule by mouth daily.    Taking     Review of Systems   All systems reviewed and negative except as stated in HPI  Blood pressure 130/73, pulse 96, temperature 98 F (36.7 C), resp. rate 18, last menstrual period 12/18/2014. General appearance: alert, cooperative and appears stated age Lungs: clear to auscultation bilaterally Heart: regular rate and rhythm Abdomen: soft, non-tender; bowel sounds normal Pelvic: adequate Extremities: Homans sign is negative, no sign of DVT DTR's wnl Presentation: cephalic Fetal monitoringBaseline: 130 bpm, Variability: Good {> 6 bpm), Accelerations: Reactive and Decelerations: Absent Uterine activityFrequency: Every 4-5 minutes  Dilation: 2 Effacement (%): 70, 80 Station: -2 Exam by:: Ginger Morris RN   Prenatal labs: ABO, Rh: O/POS/-- (05/23 1610) Antibody: NEG  (05/23 0903) Rubella: !Error! RPR: NON REAC (10/17 0910)  HBsAg: NEGATIVE (05/23 0903)  HIV: NONREACTIVE (10/17 0910)  GBS: Positive (12/16 0000)  1 hr Glucola 92 Genetic screening  wnl/negative Anatomy US wnl  Prenatal Transfer Tool  Maternal Diabetes: No Genetic Screening: Normal Maternal Ultrasounds/Referrals: Normal Fetal Ultrasounds or other Referrals:  None Maternal Substance Abuse:  No Significant Maternal Medications:  None Significant Maternal Lab Results: Lab values include: Group B Strep positive  No results found for this or any previous visit (from the past 24 hour(s)).  Patient Active Problem List   Diagnosis Date Noted  . ROM (rupture of membranes), premature 09/18/2015  . Premature rupture of membranes 09/18/2015  . Group B Streptococcus carrier, +RV culture, currently pregnant 09/10/2015  . Systemic lupus erythematosus (SLE) affecting pregnancy, antepartum (HCC)   . Supervision of high-risk pregnancy 03/11/2015  . Pure hypercholesterolemia 07/25/2013  . GERD (gastroesophageal reflux disease) 07/25/2013  . IBS (irritable bowel syndrome) 07/25/2013  . Lupus (HCC) 07/24/2013  . Hypothyroidism 07/24/2013    Assessment: Melissa Burch is a 29 y.o. G1P0000 at [redacted]w[redacted]d here for ROM  #Labor: Currently contracting, will monitor and augment with pitocin if appropriate #Pain: IV meds prn #FWB: Cat I #ID:  GBS pos, start PCN #MOF: Breast #MOC:natural FP #Circ:  NA  Federico Flake 09/18/2015, 3:39 PM

## 2015-09-19 ENCOUNTER — Encounter (HOSPITAL_COMMUNITY): Payer: Self-pay | Admitting: *Deleted

## 2015-09-19 ENCOUNTER — Inpatient Hospital Stay (HOSPITAL_COMMUNITY): Admission: RE | Admit: 2015-09-19 | Payer: 59 | Source: Ambulatory Visit

## 2015-09-19 DIAGNOSIS — Z3A39 39 weeks gestation of pregnancy: Secondary | ICD-10-CM

## 2015-09-19 DIAGNOSIS — O4202 Full-term premature rupture of membranes, onset of labor within 24 hours of rupture: Secondary | ICD-10-CM

## 2015-09-19 DIAGNOSIS — Z22330 Carrier of Group B streptococcus: Secondary | ICD-10-CM

## 2015-09-19 DIAGNOSIS — Z8759 Personal history of other complications of pregnancy, childbirth and the puerperium: Secondary | ICD-10-CM

## 2015-09-19 LAB — RPR: RPR: NONREACTIVE

## 2015-09-19 MED ORDER — LEVOTHYROXINE SODIUM 150 MCG PO TABS
150.0000 ug | ORAL_TABLET | Freq: Every day | ORAL | Status: DC
  Administered 2015-09-20 – 2015-09-21 (×2): 150 ug via ORAL
  Filled 2015-09-19 (×3): qty 1

## 2015-09-19 MED ORDER — SIMETHICONE 80 MG PO CHEW: 80.0000 mg | CHEWABLE_TABLET | ORAL | Status: DC | PRN

## 2015-09-19 MED ORDER — ONDANSETRON HCL 4 MG PO TABS
4.0000 mg | ORAL_TABLET | ORAL | Status: DC | PRN
Start: 2015-09-19 — End: 2015-09-21

## 2015-09-19 MED ORDER — DIBUCAINE 1 % RE OINT
1.0000 "application " | TOPICAL_OINTMENT | RECTAL | Status: DC | PRN
  Filled 2015-09-19: qty 28

## 2015-09-19 MED ORDER — METHYLERGONOVINE MALEATE 0.2 MG/ML IJ SOLN: 0.2000 mg | INTRAMUSCULAR | Status: DC | PRN

## 2015-09-19 MED ORDER — BENZOCAINE-MENTHOL 20-0.5 % EX AERO
1.0000 "application " | INHALATION_SPRAY | CUTANEOUS | Status: DC | PRN
  Filled 2015-09-19: qty 56

## 2015-09-19 MED ORDER — WITCH HAZEL-GLYCERIN EX PADS: 1.0000 "application " | MEDICATED_PAD | CUTANEOUS | Status: DC | PRN

## 2015-09-19 MED ORDER — RISAQUAD PO CAPS
1.0000 | ORAL_CAPSULE | Freq: Every day | ORAL | Status: DC
  Administered 2015-09-19 – 2015-09-21 (×3): 1 via ORAL
  Filled 2015-09-19 (×4): qty 1

## 2015-09-19 MED ORDER — GENTAMICIN SULFATE 40 MG/ML IJ SOLN
180.0000 mg | Freq: Three times a day (TID) | INTRAVENOUS | Status: DC
  Administered 2015-09-19: 180 mg via INTRAVENOUS
  Filled 2015-09-19 (×2): qty 4.5

## 2015-09-19 MED ORDER — ZOLPIDEM TARTRATE 5 MG PO TABS: 5.0000 mg | ORAL_TABLET | Freq: Every evening | ORAL | Status: DC | PRN

## 2015-09-19 MED ORDER — TERBUTALINE SULFATE 1 MG/ML IJ SOLN: 0.2500 mg | Freq: Once | INTRAMUSCULAR | Status: DC | PRN

## 2015-09-19 MED ORDER — PRENATAL MULTIVITAMIN CH
1.0000 | ORAL_TABLET | Freq: Every day | ORAL | Status: DC
  Filled 2015-09-19: qty 1

## 2015-09-19 MED ORDER — OXYTOCIN 40 UNITS IN LACTATED RINGERS INFUSION - SIMPLE MED
1.0000 m[IU]/min | INTRAVENOUS | Status: DC
  Administered 2015-09-19: 4 m[IU]/min via INTRAVENOUS

## 2015-09-19 MED ORDER — METHYLERGONOVINE MALEATE 0.2 MG PO TABS: 0.2000 mg | ORAL_TABLET | ORAL | Status: DC | PRN

## 2015-09-19 MED ORDER — DIPHENHYDRAMINE HCL 25 MG PO CAPS: 25.0000 mg | ORAL_CAPSULE | Freq: Four times a day (QID) | ORAL | Status: DC | PRN

## 2015-09-19 MED ORDER — ONDANSETRON HCL 4 MG/2ML IJ SOLN: 4.0000 mg | INTRAMUSCULAR | Status: DC | PRN

## 2015-09-19 MED ORDER — IBUPROFEN 600 MG PO TABS
600.0000 mg | ORAL_TABLET | Freq: Four times a day (QID) | ORAL | Status: DC
  Administered 2015-09-19 – 2015-09-21 (×9): 600 mg via ORAL
  Filled 2015-09-19 (×9): qty 1

## 2015-09-19 MED ORDER — TETANUS-DIPHTH-ACELL PERTUSSIS 5-2.5-18.5 LF-MCG/0.5 IM SUSP: 0.5000 mL | Freq: Once | INTRAMUSCULAR | Status: DC

## 2015-09-19 MED ORDER — AMPICILLIN SODIUM 2 G IJ SOLR
2.0000 g | Freq: Four times a day (QID) | INTRAMUSCULAR | Status: DC
Start: 2015-09-19 — End: 2015-09-19
  Administered 2015-09-19: 2 g via INTRAVENOUS
  Filled 2015-09-19 (×3): qty 2000

## 2015-09-19 MED ORDER — ACETAMINOPHEN 325 MG PO TABS
650.0000 mg | ORAL_TABLET | ORAL | Status: DC | PRN
Start: 2015-09-19 — End: 2015-09-21

## 2015-09-19 MED ORDER — DOCUSATE SODIUM 100 MG PO CAPS
100.0000 mg | ORAL_CAPSULE | Freq: Every day | ORAL | Status: DC
  Administered 2015-09-19 – 2015-09-20 (×2): 100 mg via ORAL
  Filled 2015-09-19 (×2): qty 1

## 2015-09-19 MED ORDER — VITAMIN D3 25 MCG (1000 UNIT) PO TABS
1000.0000 [IU] | ORAL_TABLET | Freq: Every day | ORAL | Status: DC
  Administered 2015-09-19 – 2015-09-21 (×3): 1000 [IU] via ORAL
  Filled 2015-09-19 (×4): qty 1

## 2015-09-19 MED ORDER — OXYCODONE-ACETAMINOPHEN 5-325 MG PO TABS: 2.0000 | ORAL_TABLET | ORAL | Status: DC | PRN

## 2015-09-19 MED ORDER — PRENATAL MULTIVITAMIN CH
1.0000 | ORAL_TABLET | Freq: Every day | ORAL | Status: DC
  Administered 2015-09-19 – 2015-09-21 (×3): 1 via ORAL
  Filled 2015-09-19 (×2): qty 1

## 2015-09-19 MED ORDER — OXYCODONE-ACETAMINOPHEN 5-325 MG PO TABS: 1.0000 | ORAL_TABLET | ORAL | Status: DC | PRN

## 2015-09-19 MED ORDER — SENNOSIDES-DOCUSATE SODIUM 8.6-50 MG PO TABS
2.0000 | ORAL_TABLET | ORAL | Status: DC
  Administered 2015-09-19 – 2015-09-20 (×2): 2 via ORAL
  Filled 2015-09-19 (×2): qty 2

## 2015-09-19 MED ORDER — LANOLIN HYDROUS EX OINT: TOPICAL_OINTMENT | CUTANEOUS | Status: DC | PRN

## 2015-09-19 NOTE — Lactation Note (Signed)
This note was copied from the chart of Melissa Willene HatchetMaggie Zaragosa. Lactation Consultation Note  Patient Name: Melissa Burch ZOXWR'UToday's Date: 09/19/2015 Reason for consult: Initial assessment  Baby 6 hours old. Mom reports that baby has latched several times, but baby is now fussy and not wanting to latch. Mom has a spoon at bedside and wants assistance with hand expression and spoon-feeding. Assisted mom to latch baby in football position, but baby very fussy and crying too much to latch. Enc mom to console baby, but baby cried each time she was moved to attempt to latch. Enc mom to keep baby STS and dribble EBM into her mouth as she is able. Demonstrated hand express, and mom had one small drop which was given to baby with this LC's gloved finger--which also caused the baby to cry. Discussed with mom the importance of hormone levels, including thyroid, to milk production. Enc mom to make sure her post-delivery levels are monitored.   [facility], aware of OP/BFSG and LC phone line assistance after D/C.  Maternal Data Has patient been taught Hand Expression?: Yes Does the patient have breastfeeding experience prior to this delivery?: No  Feeding Feeding Type: Breast Fed Length of feed: 0 min  LATCH Score/Interventions Latch: Too sleepy or reluctant, no latch achieved, no sucking elicited. Intervention(s): Adjust position;Assist with latch;Breast compression  Audible Swallowing: None  Type of Nipple: Flat  Comfort (Breast/Nipple): Soft / non-tender     Hold (Positioning): Assistance needed to correctly position infant at breast and maintain latch. Intervention(s): Breastfeeding basics reviewed;Support Pillows;Position options;Skin to skin  LATCH Score: 4  Lactation Tools Discussed/Used     Consult Status Consult Status: Follow-up Date: 09/20/15 Follow-up type: In-patient    [name], [name] 09/19/2015, 1:00 PM

## 2015-09-19 NOTE — Anesthesia Postprocedure Evaluation (Signed)
Anesthesia Post Note  Patient: Melissa HatchetMaggie Burch  Procedure(s) Performed: * No procedures listed *  Patient location during evaluation: Mother Baby Anesthesia Type: Epidural Level of consciousness: awake and alert and oriented Pain management: satisfactory to patient Vital Signs Assessment: post-procedure vital signs reviewed and stable Respiratory status: spontaneous breathing and nonlabored ventilation Cardiovascular status: stable Postop Assessment: no headache, no backache, no signs of nausea or vomiting, adequate PO intake and patient able to bend at knees (patient up walking) Anesthetic complications: no    Last Vitals:  Filed Vitals:   09/19/15 0850 09/19/15 1000  BP: 112/63 110/60  Pulse: 99 100  Temp: 36.8 C 36.6 C  Resp: 18 20    Last Pain:  Filed Vitals:   09/19/15 1052  PainSc: 0-No pain                 Bertha Lokken

## 2015-09-19 NOTE — Progress Notes (Signed)
ANTIBIOTIC CONSULT NOTE - INITIAL  Pharmacy Consult for Genatmicin Indication: Chorioamnionitis  No Known Allergies  Patient Measurements: Height: 5\' 6"  (167.6 cm) Weight: 192 lb (87.091 kg) IBW/kg (Calculated) : 59.3 Adjusted Body Weight:67.7 kg  Vital Signs: Temp: 100.6 F (38.1 C) (12/29 0302) Temp Source: Oral (12/29 0302) BP: 118/68 mmHg (12/29 0401) Pulse Rate: 114 (12/29 0401) Intake/Output from previous day:   Intake/Output from this shift:    Labs:  Recent Labs  09/18/15 1515  WBC 9.5  HGB 12.3  PLT 253   CrCl cannot be calculated (Patient has no serum creatinine result on file.). No results for input(s): VANCOTROUGH, VANCOPEAK, VANCORANDOM, GENTTROUGH, GENTPEAK, GENTRANDOM, TOBRATROUGH, TOBRAPEAK, TOBRARND, AMIKACINPEAK, AMIKACINTROU, AMIKACIN in the last 72 hours.   Microbiology: Recent Results (from the past 720 hour(s))  Culture, beta strep (group b only)     Status: None   Collection Time: 08/27/15  4:00 PM  Result Value Ref Range Status   Organism ID, Bacteria GROUP B STREP (S.[email or web address]) ISOLATED  Final    Comment: Testing against S. agalactiae not routinely performed due to predictability of AMP/PEN/VAN susceptibility.   OB RESULT CONSOLE Group B Strep     Status: None   Collection Time: 09/06/15 12:00 AM  Result Value Ref Range Status   GBS Positive  Final    Medical History: Past Medical History  Diagnosis Date  . Lupus (HCC)   . Thyroid disease   . Vitamin D deficiency     Medications:  Penicillin for GBS prophylaxis Ampicillin 2 Gm IV every 6  Hours  Assessment: 29 yo G1P0000 at 2635w2d admitted for ROM; Now with increased temperature and presumed chorioamnionitis.  Goal of Therapy:  Gentamicin peals 6-8 mcg/ml; troughs <1 mcg/ml  Plan:  Gentamicin 180 mg IV every 8 hours Monitor serum creatinine per protocol Serum gentamicin levels as indicated  Arelia SneddonMason, Kallen Mccrystal Anne 09/19/2015,4:18 AM

## 2015-09-19 NOTE — Progress Notes (Signed)
Patient ID: Melissa HatchetMaggie Schatzman, female   DOB: 24-Nov-1985, 29 y.o.   MRN: [id number]  Comfortable. Cx 6/80/-1 FHR now 160s, +accels, no decels Ctx q 2-4 mins, spontaneous  Will recheck in 2 hrs.  Cam HaiSHAW, Colie Fugitt 09/19/2015 12:33 AM

## 2015-09-20 LAB — CBC
HCT: 26.2 % — ABNORMAL LOW (ref 36.0–46.0)
Hemoglobin: 9.1 g/dL — ABNORMAL LOW (ref 12.0–15.0)
MCH: 32.4 pg (ref 26.0–34.0)
MCHC: 34.7 g/dL (ref 30.0–36.0)
MCV: 93.2 fL (ref 78.0–100.0)
PLATELETS: 182 10*3/uL (ref 150–400)
RBC: 2.81 MIL/uL — ABNORMAL LOW (ref 3.87–5.11)
RDW: 12.7 % (ref 11.5–15.5)
WBC: 18.3 10*3/uL — AB (ref 4.0–10.5)

## 2015-09-20 MED ORDER — OXYCODONE-ACETAMINOPHEN 5-325 MG PO TABS: 1.0000 | ORAL_TABLET | ORAL | Status: DC | PRN

## 2015-09-20 MED ORDER — DOCUSATE SODIUM 100 MG PO CAPS: 100.0000 mg | ORAL_CAPSULE | Freq: Every day | ORAL | Status: DC

## 2015-09-20 MED ORDER — OXYCODONE-ACETAMINOPHEN 5-325 MG PO TABS: 1.0000 | ORAL_TABLET | Freq: Four times a day (QID) | ORAL | Status: DC | PRN

## 2015-09-20 MED ORDER — IBUPROFEN 600 MG PO TABS: 600.0000 mg | ORAL_TABLET | Freq: Four times a day (QID) | ORAL | Status: DC

## 2015-09-20 NOTE — Discharge Summary (Signed)
OB Discharge Summary     Patient Name: Melissa Burch DOB: 1986/06/14 MRN: [id number]  Date of admission: 09/18/2015 Delivering MD: Lazaro Arms   Date of discharge: 09/20/2015  Admitting diagnosis: CTX Intrauterine pregnancy: [redacted]w[redacted]d     Secondary diagnosis:  Principal Problem:   Premature rupture of membranes Active Problems:   Hypothyroidism   Supervision of high-risk pregnancy   Systemic lupus erythematosus (SLE) affecting pregnancy, antepartum (HCC)   Group B Streptococcus carrier, +RV culture, currently pregnant   ROM (rupture of membranes), premature   Status post vacuum-assisted vaginal delivery  Additional problems: III during labor     Discharge diagnosis: Term Pregnancy Delivered                                                                                                Post partum procedures:no  Augmentation: none  Complications: None  Hospital course:  Induction of Labor With Vaginal Delivery   29 y.o. yo G1P1001 at [redacted]w[redacted]d was admitted to the hospital 09/18/2015 for induction of labor.  Indication for induction: SLE at term.  Patient had an uncomplicated labor course as follows: Membrane Rupture Time/Date: 10:30 AM ,09/18/2015   Intrapartum Procedures: Episiotomy: Median [2]                                         Lacerations:  3rd degree [4]  Patient had delivery of a Viable infant.  Information for the patient's newborn:  Cadince, Hilscher [[id number]]  Delivery Method: Vag-Vacuum   09/19/2015  Details of delivery can be found in separate delivery note.  Patient had a routine postpartum course. Patient is discharged home No discharge date for patient encounter.    Physical exam  Filed Vitals:   09/19/15 1412 09/19/15 1446 09/19/15 2156 09/20/15 0536  BP: 113/62 96/56 118/64 114/61  Pulse: 100 87 75 85  Temp: 98.2 F (36.8 C) 98 F (36.7 C) 98.2 F (36.8 C) 97.5 F (36.4 C)  TempSrc: Oral Oral  Oral  Resp: Height:       Weight:      SpO2: 100% 99% 98% 100%   General: alert, cooperative and no distress Lochia: appropriate Uterine Fundus: firm Incision: N/A DVT Evaluation: No evidence of DVT seen on physical exam. Labs: Lab Results  Component Value Date   WBC 18.3* 09/20/2015   HGB 9.1* 09/20/2015   HCT 26.2* 09/20/2015   MCV 93.2 09/20/2015   PLT 182 09/20/2015   CMP Latest Ref Rng 03/04/2015  Glucose 70 - 99 mg/dL -  BUN 6 - 23 mg/dL -  Creatinine 7.82 - 9.56 mg/dL 2.13  Sodium 086 - 578 mEq/L -  Potassium 3.5 - 5.3 mEq/L -  Chloride 96 - 112 mEq/L -  CO2 19 - 32 mEq/L -  Calcium 8.4 - 10.5 mg/dL -  Total Protein 6.0 - 8.3 g/dL -  Total Bilirubin 0.2 - 1.2 mg/dL -  Alkaline Phos 39 - 469 U/L -  AST  0 - 37 U/L -  ALT 0 - 35 U/L -    Discharge instruction: per After Visit Summary and "Baby and Me Booklet".  After visit meds:    Medication List    STOP taking these medications        cholecalciferol 1000 units tablet  Commonly known as:  VITAMIN D     PROBIOTIC DAILY PO      TAKE these medications        docusate sodium 100 MG capsule  Commonly known as:  COLACE  Take 1 capsule (100 mg total) by mouth at bedtime.     hydroxychloroquine 200 MG tablet  Commonly known as:  PLAQUENIL  Take 200 mg by mouth 2 (two) times daily. Reported on 09/13/2015     ibuprofen 600 MG tablet  Commonly known as:  ADVIL,MOTRIN  Take 1 tablet (600 mg total) by mouth every 6 (six) hours.     levothyroxine 150 MCG tablet  Commonly known as:  SYNTHROID  Take 1 tablet (150 mcg total) by mouth daily before breakfast.     oxyCODONE-acetaminophen 5-325 MG tablet  Commonly known as:  PERCOCET/ROXICET  Take 1-2 tablets by mouth every 6 (six) hours as needed (for pain scale 4-7).     prenatal multivitamin Tabs tablet  Take 1 tablet by mouth daily at 12 noon.        Diet: routine diet  Activity: Advance as tolerated. Pelvic rest for 6 weeks.   Outpatient follow up:6 weeks Follow up  Appt:No future appointments. Follow up Visit:No Follow-up on file.  Postpartum contraception: Undecided  Newborn Data: Live born female  Birth Weight: 6 lb 11.1 oz (3035 g) APGAR: 6, 9  Baby Feeding: Breast Disposition:home with mother   09/20/2015 Scheryl DarterARNOLD,JAMES, MD

## 2015-09-20 NOTE — Plan of Care (Signed)
Problem: Nutritional: Goal: Mother's verbalization of comfort with breastfeeding process will improve Outcome: Progressing Pt states that her comfort is improving now that her supply is increasing and baby was able to take 1tsp by curved tip syringe this am.

## 2015-09-20 NOTE — Lactation Note (Signed)
This note was copied from the chart of Girl Willene HatchetMaggie Lupa. Lactation Consultation Note   Follow up with this mom and term baby, now 530 hours old, and mom not able to get baby latched. On exam of baby's mouth, she has a thick, mid posterior , short frenulum, that is causing her tongue to not reach her high palate, and it cups with elevation. I fitted mom with a 24 nipple shield, and the baby was able to maintain latch and suckle.  Since the baby is acting very hungry, and has not fed well, mom agreed to supplementing with formula, in addition her EBm/colostrum. The baby was bottle fed, after breast, and took 20 ml's of alimentum, and tolerated well. Baby calm and content. Mom has a DEP at home.  Baby was later in day made a baby patient, due to poor feeding earlier. Mom knows to call for questions/concerns.   Patient Name: Girl Willene HatchetMaggie Stair ZOXWR'UToday's Date: 09/20/2015     Maternal Data Formula Feeding for Exclusion: No Has patient been taught Hand Expression?: Yes Does the patient have breastfeeding experience prior to this delivery?: No  Feeding Feeding Type: Other (comment) (breast fed and formula) Length of feed: 15 min  LATCH Score/Interventions                      Lactation Tools Discussed/Used     Consult Status      Alfred LevinsLee, Bayli Quesinberry Anne 09/20/2015, 5:52 PM

## 2015-09-21 MED ORDER — IBUPROFEN 800 MG PO TABS
800.0000 mg | ORAL_TABLET | Freq: Three times a day (TID) | ORAL | Status: DC | PRN
Start: 2015-09-21 — End: 2015-10-28

## 2015-09-21 NOTE — Discharge Summary (Signed)
OB Discharge Summary     Patient Name: Melissa HatchetMaggie Burch DOB: [birth year] MRN: [id number]  Date of admission: 09/18/2015 Delivering MD: Lazaro ArmsEURE, LUTHER H   Date of discharge: 09/21/2015  Admitting diagnosis: CTX Intrauterine pregnancy: 4143w2d     Secondary diagnosis:  Principal Problem:   Premature rupture of membranes Active Problems:   Hypothyroidism   Supervision of high-risk pregnancy   Systemic lupus erythematosus (SLE) affecting pregnancy, antepartum (HCC)   Group B Streptococcus carrier, +RV culture, currently pregnant   ROM (rupture of membranes), premature   Status post vacuum-assisted vaginal delivery   Obstetrical laceration, third degree  Additional problems:      Discharge diagnosis: Term Pregnancy Delivered                                                                                                Post partum procedures:none  Augmentation: pitocin  Complications: partial 3rd degree  Hospital course:  Augmentation of Labor With Vaginal Delivery   29 y.o. yo G1P1001 at 8443w2d was admitted to the hospital 09/18/2015 for induction of labor.  Indication for induction: ROM.  Patient had an uncomplicated labor course as follows: Membrane Rupture Time/Date: 10:30 AM ,09/18/2015   Intrapartum Procedures: Episiotomy: Median [2]                                         Lacerations:  3rd degree [4]  Patient had delivery of a Viable infant.  Information for the patient's newborn:  Wandra ArthursLangford, Girl Varie [[id number]][[id number]]  Delivery Method: Vag-Vacuum   09/19/2015  Details of delivery can be found in separate delivery note.  Patient had a routine postpartum course. Patient is discharged home 09/21/2015.   Physical exam  Filed Vitals:   09/20/15 1313 09/20/15 1804 09/20/15 2134 09/21/15 0511  BP: 113/57 110/65 113/62 115/60  Pulse: 64 83 81 74  Temp: 97.8 F (36.6 C) 98 F (36.7 C) 98 F (36.7 C) 98.2 F (36.8 C)  TempSrc: Oral Oral Oral Oral  Resp: 16 18 18 18   Height:       Weight:      SpO2: 100% 99% 100% 99%   General: alert, cooperative and no distress Lochia: appropriate Uterine Fundus: firm Incision: N/A DVT Evaluation: No evidence of DVT seen on physical exam. Labs: Lab Results  Component Value Date   WBC 18.3* 09/20/2015   HGB 9.1* 09/20/2015   HCT 26.2* 09/20/2015   MCV 93.2 09/20/2015   PLT 182 09/20/2015   CMP Latest Ref Rng 03/04/2015  Glucose 70 - 99 mg/dL -  BUN 6 - 23 mg/dL -  Creatinine 3.240.50 - 4.011.10 mg/dL 0.270.61  Sodium 253135 - 664145 mEq/L -  Potassium 3.5 - 5.3 mEq/L -  Chloride 96 - 112 mEq/L -  CO2 19 - 32 mEq/L -  Calcium 8.4 - 10.5 mg/dL -  Total Protein 6.0 - 8.3 g/dL -  Total Bilirubin 0.2 - 1.2 mg/dL -  Alkaline Phos 39 - 403117 U/L -  AST 0 -  37 U/L -  ALT 0 - 35 U/L -    Discharge instruction: per After Visit Summary and "Baby and Me Booklet".  After visit meds:    Medication List    STOP taking these medications        cholecalciferol 1000 units tablet  Commonly known as:  VITAMIN D     PROBIOTIC DAILY PO      TAKE these medications        docusate sodium 100 MG capsule  Commonly known as:  COLACE  Take 1 capsule (100 mg total) by mouth at bedtime.     hydroxychloroquine 200 MG tablet  Commonly known as:  PLAQUENIL  Take 200 mg by mouth 2 (two) times daily. Reported on 09/13/2015     ibuprofen 600 MG tablet  Commonly known as:  ADVIL,MOTRIN  Take 1 tablet (600 mg total) by mouth every 6 (six) hours.     ibuprofen 800 MG tablet  Commonly known as:  ADVIL,MOTRIN  Take 1 tablet (800 mg total) by mouth every 8 (eight) hours as needed.     levothyroxine 150 MCG tablet  Commonly known as:  SYNTHROID  Take 1 tablet (150 mcg total) by mouth daily before breakfast.     oxyCODONE-acetaminophen 5-325 MG tablet  Commonly known as:  PERCOCET/ROXICET  Take 1-2 tablets by mouth every 6 (six) hours as needed (for pain scale 4-7).     prenatal multivitamin Tabs tablet  Take 1 tablet by mouth daily at 12 noon.         Diet: routine diet  Activity: Advance as tolerated. Pelvic rest for 6 weeks.   Outpatient follow up:5 weeks Follow up Appt:No future appointments. Follow up Visit:No Follow-up on file.  Postpartum contraception: None  Newborn Data: Live born female  Birth Weight: 6 lb 11.1 oz (3035 g) APGAR: 6, 9  Baby Feeding: Breast Disposition:home with mother   09/21/2015 Lazaro Arms, MD

## 2015-09-21 NOTE — Lactation Note (Signed)
This note was copied from the chart of Melissa Willene HatchetMaggie Ofarrell. Lactation Consultation Note  Patient Name: Melissa Burch JXBJY'NToday's Date: 09/21/2015 Reason for consult: Follow-up assessment;Hyperbilirubinemia;Other (Comment) (repeat serum Bili at 1050 - pending )  Baby is 52 hours old and per mom recently breast fed at 1035 for 10 mins with a #24 NS , and milk noted in the NS after the  Feeding was complete. [facility] been [facility] set up yesterday from previous LC.  This LC reviewed the Plan and stressed the importance of weekly weight checks until the baby is gaining well, and ( especially being up  To birth weight by 14 days of life) . Discussed importance of consistent post pumping when the baby isn't cluster feeding 10 -20 mins ' After the feeding. Save the milk and feed the EBM back to the baby. Also using 4 ml instilled into the top of the NS before latch.  And supplement afterwards after up to 30 ml. Until milk comes in EBM and or Formula.  Sore nipple and engorgement prevention and tx reviewed. Per mom has a DEBP at home. ( Medela ). LC offered mom a F/U LC Appt. . Mom declined - per mom Marcial Pacasovant  [place] has a LC and she will be seeing them.  LC referred to the Baby and me booklet.  Mother informed of post-discharge support and given phone number to the lactation department, including services for phone call assistance; out-patient appointments; and breastfeeding support group. List of other breastfeeding resources in the community given in the handout. Encouraged mother to call for problems or concerns related to breastfeeding.    Maternal Data    Feeding Feeding Type:  (per mom baby fed at 1035 for 10 mins ) Length of feed: 10 min (per mom )  LATCH Score/Interventions                Intervention(s): Breastfeeding basics reviewed (see LC note )     Lactation Tools Discussed/Used WIC Program: No   Consult Status Consult Status:  Complete Date: 09/21/15 (LC offered mom and LC O/P appt. and [facility] )    Melissa Burch, Melissa Burch 09/21/2015, 11:36 AM

## 2015-09-21 NOTE — Progress Notes (Signed)
Pt is discharged in the care of husband withN.T. Escort. Infant discharged in the of parents. Denies pain or discomfort. Understands all discharged instructions for Mother and infant.  Stable No equipment needed for home use.

## 2015-09-21 NOTE — Discharge Instructions (Signed)
Vaginal Delivery, Care After °Refer to this sheet in the next few weeks. These discharge instructions provide you with information on caring for yourself after delivery. Your health care provider may also give you specific instructions. Your treatment has been planned according to the most current medical practices available, but problems sometimes occur. Call your health care provider if you have any problems or questions after you go home. °HOME CARE INSTRUCTIONS °· Take over-the-counter or prescription medicines only as directed by your health care provider or pharmacist. °· Do not drink alcohol, especially if you are breastfeeding or taking medicine to relieve pain. °· Do not chew or smoke tobacco. °· Do not use illegal drugs. °· Continue to use good perineal care. Good perineal care includes: °¨ Wiping your perineum from front to back. °¨ Keeping your perineum clean. °· Do not use tampons or douche until your health care provider says it is okay. °· Shower, wash your hair, and take tub baths as directed by your health care provider. °· Wear a well-fitting bra that provides breast support. °· Eat healthy foods. °· Drink enough fluids to keep your urine clear or pale yellow. °· Eat high-fiber foods such as whole grain cereals and breads, brown rice, beans, and fresh fruits and vegetables every day. These foods may help prevent or relieve constipation. °· Follow your health care provider's recommendations regarding resumption of activities such as climbing stairs, driving, lifting, exercising, or traveling. °· Talk to your health care provider about resuming sexual activities. Resumption of sexual activities is dependent upon your risk of infection, your rate of healing, and your comfort and desire to resume sexual activity. °· Try to have someone help you with your household activities and your newborn for at least a few days after you leave the hospital. °· Rest as much as possible. Try to rest or take a nap  when your newborn is sleeping. °· Increase your activities gradually. °· Keep all of your scheduled postpartum appointments. It is very important to keep your scheduled follow-up appointments. At these appointments, your health care provider will be checking to make sure that you are healing physically and emotionally. °SEEK MEDICAL CARE IF:  °· You are passing large clots from your vagina. Save any clots to show your health care provider. °· You have a foul smelling discharge from your vagina. °· You have trouble urinating. °· You are urinating frequently. °· You have pain when you urinate. °· You have a change in your bowel movements. °· You have increasing redness, pain, or swelling near your vaginal incision (episiotomy) or vaginal tear. °· You have pus draining from your episiotomy or vaginal tear. °· Your episiotomy or vaginal tear is separating. °· You have painful, hard, or reddened breasts. °· You have a severe headache. °· You have blurred vision or see spots. °· You feel sad or depressed. °· You have thoughts of hurting yourself or your newborn. °· You have questions about your care, the care of your newborn, or medicines. °· You are dizzy or light-headed. °· You have a rash. °· You have nausea or vomiting. °· You were breastfeeding and have not had a menstrual period within 12 weeks after you stopped breastfeeding. °· You are not breastfeeding and have not had a menstrual period by the 12th week after delivery. °· You have a fever. °SEEK IMMEDIATE MEDICAL CARE IF:  °· You have persistent pain. °· You have chest pain. °· You have shortness of breath. °· You faint. °· You   have leg pain.  You have stomach pain.  Your vaginal bleeding saturates two or more sanitary pads in 1 hour.   This information is not intended to replace advice given to you by your health care provider. Make sure you discuss any questions you have with your health care provider.   Document Released: 09/04/2000 Document Revised:  05/29/2015 Document Reviewed: 05/04/2012 Elsevier Interactive Patient Education 2016 [employer]   Vaginal Delivery, Care After Refer to this sheet in the next few weeks. These discharge instructions provide you with information on caring for yourself after delivery. Your health care provider may also give you specific instructions. Your treatment has been planned according to the most current medical practices available, but problems sometimes occur. Call your health care provider if you have any problems or questions after you go home. HOME CARE INSTRUCTIONS  Take over-the-counter or prescription medicines only as directed by your health care provider or pharmacist.  Do not drink alcohol, especially if you are breastfeeding or taking medicine to relieve pain.  Do not chew or smoke tobacco.  Do not use illegal drugs.  Continue to use good perineal care. Good perineal care includes:  Wiping your perineum from front to back.  Keeping your perineum clean.  Do not use tampons or douche until your health care provider says it is okay.  Shower, wash your hair, and take tub baths as directed by your health care provider.  Wear a well-fitting bra that provides breast support.  Eat healthy foods.  Drink enough fluids to keep your urine clear or pale yellow.  Eat high-fiber foods such as whole grain cereals and breads, brown rice, beans, and fresh fruits and vegetables every day. These foods may help prevent or relieve constipation.  Follow your health care provider's recommendations regarding resumption of activities such as climbing stairs, driving, lifting, exercising, or traveling.  Talk to your health care provider about resuming sexual activities. Resumption of sexual activities is dependent upon your risk of infection, your rate of healing, and your comfort and desire to resume sexual activity.  Try to have someone help you with your household activities and your newborn for at  least a few days after you leave the hospital.  Rest as much as possible. Try to rest or take a nap when your newborn is sleeping.  Increase your activities gradually.  Keep all of your scheduled postpartum appointments. It is very important to keep your scheduled follow-up appointments. At these appointments, your health care provider will be checking to make sure that you are healing physically and emotionally. SEEK MEDICAL CARE IF:   You are passing large clots from your vagina. Save any clots to show your health care provider.  You have a foul smelling discharge from your vagina.  You have trouble urinating.  You are urinating frequently.  You have pain when you urinate.  You have a change in your bowel movements.  You have increasing redness, pain, or swelling near your vaginal incision (episiotomy) or vaginal tear.  You have pus draining from your episiotomy or vaginal tear.  Your episiotomy or vaginal tear is separating.  You have painful, hard, or reddened breasts.  You have a severe headache.  You have blurred vision or see spots.  You feel sad or depressed.  You have thoughts of hurting yourself or your newborn.  You have questions about your care, the care of your newborn, or medicines.  You are dizzy or light-headed.  You have a rash.  You have nausea or vomiting.  You were breastfeeding and have not had a menstrual period within 12 weeks after you stopped breastfeeding.  You are not breastfeeding and have not had a menstrual period by the 12th week after delivery.  You have a fever. SEEK IMMEDIATE MEDICAL CARE IF:   You have persistent pain.  You have chest pain.  You have shortness of breath.  You faint.  You have leg pain.  You have stomach pain.  Your vaginal bleeding saturates two or more sanitary pads in 1 hour.   This information is not intended to replace advice given to you by your health care provider. Make sure you discuss any  questions you have with your health care provider.   Document Released: 09/04/2000 Document Revised: 05/29/2015 Document Reviewed: 05/04/2012 Elsevier Interactive Patient Education 2016 Else[employer] Inc[employer] Episiotomy, Care After Refer to this sheet in the next few weeks. These instructions provide you with information on caring for yourself after your procedure. Your health care provider may also give you more specific instructions. Your treatment has been planned according to current medical practices, but problems sometimes occur. Call your health care provider if you have any problems or questions after your procedure. WHAT TO EXPECT AFTER THE PROCEDURE After your procedure, it is typical to have the following sensations:  Pain or aching around the episiotomy site.  Redness and mild swelling around the episiotomy site.  A tugging or pulling sensation at the episiotomy site from the stitches. HOME CARE INSTRUCTIONS   The first day, put ice on the episiotomy area.  Put ice in a plastic bag.  Place a towel between your skin and the bag.  Leave the ice on for 20 minutes, 2-3 times a day.  Bathe using a warm sitz bath as directed by your health care provider. This can speed up healing. Sitz baths can be performed in your bathtub or using a sitz bath kit that fits over your toilet.  Place 3-4 inches of warm water in your bathtub or fill the sitz bath over-the-toilet container with warm water. Make sure the water is not too hot by placing a drop on your wrist.  Sit in the warm water for 20-30 minutes.  After bathing, pat your perineum dry with a clean towel. Do not scrub the perineum as this could cause pain, irritation, or open any stitches you may have.  Keep the over-the-toilet sitz bath container clean by rinsing it thoroughly after each use. Ask for help in keeping the bathtub clean with diluted bleach and water (2 tablespoons of bleach to one half gallon of water).  Repeat the sitz  bath as often as you would like to relieve perineal pain, itching, or discomfort.  Apply a numbing spray to the episiotomy site as directed by your health care provider. This may help with discomfort.  Wash your hands before and after applying medicine to the episiotomy area.  Put about 3 witch hazel-containing hemorrhoid treatment pads on top of your sanitary pad. The witch hazel in the hemorrhoid pads helps with discomfort and swelling.  Get a peri-bottle to squeeze warm water on your perineum when urinating, spraying the area from front to back. Pat the area to dry.  Sitting on an inflatable ring or pillow may provide comfort.  Only take over-the-counter or prescription medicines for pain, discomfort, or fever as directed by your health care provider.  Do not have sexual intercourse or use tampons until your health care provider says it is  okay. Typically, you must wait at least 6 weeks.  Keep all postpartum appointments. SEEK MEDICAL CARE IF:   Your pain is not relieved with medicines.  You have painful urination.  You have a fever. SEEK IMMEDIATE MEDICAL CARE IF:   You have redness, swelling, or increasing pain in the episiotomy area.  You have pus coming from the episiotomy area.  You notice a bad smell coming from the episiotomy area.  Your episiotomy opens.  You notice swelling in the episiotomy area that is larger than when you left the hospital.  You cannot urinate.   This information is not intended to replace advice given to you by your health care provider. Make sure you discuss any questions you have with your health care provider.   Document Released: 09/07/2005 Document Revised: 09/28/2014 Document Reviewed: 06/13/2013 Elsevier Interactive Patient Education [employer].

## 2015-10-23 ENCOUNTER — Other Ambulatory Visit: Payer: Self-pay | Admitting: Obstetrics & Gynecology

## 2015-10-24 ENCOUNTER — Ambulatory Visit (INDEPENDENT_AMBULATORY_CARE_PROVIDER_SITE_OTHER): Payer: 59 | Admitting: Obstetrics & Gynecology

## 2015-10-24 ENCOUNTER — Encounter: Payer: Self-pay | Admitting: Obstetrics & Gynecology

## 2015-10-24 VITALS — BP 126/79 | HR 73 | Resp 16 | Ht 66.0 in | Wt 166.0 lb

## 2015-10-24 DIAGNOSIS — Z8742 Personal history of other diseases of the female genital tract: Secondary | ICD-10-CM

## 2015-10-24 DIAGNOSIS — O872 Hemorrhoids in the puerperium: Secondary | ICD-10-CM

## 2015-10-24 DIAGNOSIS — IMO0001 Reserved for inherently not codable concepts without codable children: Secondary | ICD-10-CM

## 2015-10-24 MED ORDER — HYDROCORTISONE ACE-PRAMOXINE 1-1 % RE FOAM: 1.0000 | Freq: Two times a day (BID) | RECTAL | Status: DC

## 2015-10-24 MED ORDER — NORETHINDRONE 0.35 MG PO TABS: 1.0000 | ORAL_TABLET | Freq: Every day | ORAL | Status: DC

## 2015-10-24 NOTE — Progress Notes (Signed)
Patient ID: Melissa Burch, female   DOB: 1986-09-08, 30 y.o.   MRN: [id number] Post Partum Exam  Melissa Burch is a 30 y.o. [id number] female who presents for a postpartum visit. She is 5 weeks postpartum following a vaginal delivery, extraction, grade 3 episiotomy. I have fully reviewed the prenatal and intrapartum course. The delivery was at [redacted]w[redacted]d gestational weeks.  Anesthesia: epidural. Postpartum course has been positive for vaginal soreness from episiotomy and hemorrhoids. Baby's course has been unremarkable. Baby is feeding by breast. Bleeding no bleeding. Bowel function is normal. Bladder function is normal. Patient is not sexually active. Contraception method is none. Postpartum depression screening: Score = 4  The following portions of the patient's history were reviewed and updated as appropriate: allergies, current medications, past family history, past medical history, past social history, past surgical history and problem list.  Review of Systems Pertinent items noted in HPI and remainder of comprehensive ROS otherwise negative.   Objective:    BP 116/78 mmHg  Pulse 78  Resp 16  Ht  (1.651 m)  Wt 211 lb (95.709 kg)  BMI 35.11 kg/m2  Breastfeeding? Yes  General:  alert and cooperative   Breasts:  inspection negative, no nipple discharge or bleeding, no masses or nodularity palpable  Lungs: clear to auscultation bilaterally  Heart:  regular rate and rhythm  Abdomen: soft, non-tender; bowel sounds normal; no masses,  no organomegaly   Vulva:  normal  Vagina: laceration healed, mildly tender at lacreation.  Can feel stitches under mucosa--hold off on sex for 2 weeks.  Cervix:  no lesions  Corpus: normal size, contour, position, consistency, mobility, non-tender  Adnexa:  normal adnexa and no mass, fullness, tenderness  Rectal Exam: external hemorrhoids        Assessment:    Nml postpartum exam. Hemorrhoids Plan:    1. Contraception: none, but might use condoms or  POPs 2. Proctofoam for hemorrhoids 3. Follow up in: 1 year or as needed.  4. PCP to take over hypothroid

## 2015-10-25 ENCOUNTER — Other Ambulatory Visit: Payer: Self-pay | Admitting: *Deleted

## 2015-10-25 ENCOUNTER — Telehealth: Payer: Self-pay | Admitting: *Deleted

## 2015-10-25 DIAGNOSIS — E039 Hypothyroidism, unspecified: Secondary | ICD-10-CM

## 2015-10-25 MED ORDER — LEVOTHYROXINE SODIUM 150 MCG PO TABS: 150.0000 ug | ORAL_TABLET | Freq: Every day | ORAL | Status: DC

## 2015-10-25 NOTE — Telephone Encounter (Signed)
LM on voicemail to call office to let us know who will follow her Thyroid now that she is not pregnant.

## 2015-10-25 NOTE — Telephone Encounter (Signed)
-----   Message from Lesly Dukes, MD sent at 10/24/2015  4:20 PM EST ----- Can you call patient and see if someone is going to check her TSH now that not pregnatn.

## 2015-10-28 ENCOUNTER — Encounter: Payer: Self-pay | Admitting: Physician Assistant

## 2015-10-28 ENCOUNTER — Ambulatory Visit (INDEPENDENT_AMBULATORY_CARE_PROVIDER_SITE_OTHER): Payer: 59 | Admitting: Physician Assistant

## 2015-10-28 VITALS — BP 115/63 | HR 69 | Ht 66.0 in | Wt 166.0 lb

## 2015-10-28 DIAGNOSIS — H6121 Impacted cerumen, right ear: Secondary | ICD-10-CM | POA: Diagnosis not present

## 2015-10-28 DIAGNOSIS — E039 Hypothyroidism, unspecified: Secondary | ICD-10-CM

## 2015-10-28 NOTE — Progress Notes (Addendum)
   Subjective:    Patient ID: Melissa Burch, female    DOB: 1985-12-15, 30 y.o.   MRN: [id number]  HPI  Patient is a 30 year old female presenting for postpartum follow up. Patient concerns for this visit include evaluating TSH and and T3/T4 levels to ensure appropriate therapy for hypothyroidism. Patient states that her dose of levothyroxine was increased during pregnancy.Patient has occasional episodes of sweating, palpitations, and anxiety. Patient has some rectal pains associated with hemorrhoids from vaginal delivery. Patient states that she is adjusting to motherhood well and does not experience profound episodes of frustration or dispair. Patient has a follow up appointment with her rheumatologist for SLE in March.   Review of Systems   Please see HPI Objective:   Physical Exam  Constitutional: She is oriented to person, place, and time. She appears well-developed and well-nourished. No distress.  HENT:  Head: Normocephalic and atraumatic.  Mouth/Throat: Oropharynx is clear and moist.  Patient's right tympanic membrane cannot be visualized due to wax. Patient's left tympanic membrane is partly occluded by wax.   Patient's nasal turbinates are erythematous.   Eyes: Conjunctivae and EOM are normal. Pupils are equal, round, and reactive to light. Right eye exhibits no discharge. Left eye exhibits no discharge.  Neck: Normal range of motion. No tracheal deviation present.  Patient has mild thyroid fullness.   Cardiovascular: Normal rate, regular rhythm, normal heart sounds and intact distal pulses.   Pulmonary/Chest: Effort normal and breath sounds normal. No stridor. No respiratory distress. She has no wheezes. She exhibits no tenderness.  Abdominal: Soft. Bowel sounds are normal. She exhibits no distension and no mass. There is no tenderness. There is no rebound and no guarding.  Musculoskeletal: Normal range of motion.  Lymphadenopathy:    She has no cervical adenopathy.   Neurological: She is alert and oriented to person, place, and time. She has normal reflexes. No cranial nerve deficit. Coordination normal.  Skin: Skin is warm and dry. No rash noted. She is not diaphoretic. No erythema.  Psychiatric: She has a normal mood and affect. Her behavior is normal. Judgment and thought content normal.     Assessment & Plan:  1. Hypothyroidism. Patient has a prior diagnosis of hypothyroidism.  Patient's TSH and T3/T4 levels will be checked. Levothyroxine levels may be adjusted pending lab work review.   2. Post-partum 5 weeks- she is doing well. Reports to mood changes, depression, crying. She was screened for post partum at GYN 2 weeks ago.  3. Right cerumen impaction- no treatment done today. Consider OTC debrox solution.

## 2015-10-29 ENCOUNTER — Other Ambulatory Visit: Payer: Self-pay | Admitting: Physician Assistant

## 2015-10-29 LAB — T4, FREE: Free T4: 2 ng/dL — ABNORMAL HIGH (ref 0.8–1.8)

## 2015-10-29 LAB — T3, FREE: T3, Free: 3.6 pg/mL (ref 2.3–4.2)

## 2015-10-29 LAB — TSH: TSH: 0.04 mIU/L — ABNORMAL LOW

## 2015-10-29 MED ORDER — LEVOTHYROXINE SODIUM 137 MCG PO TABS: 137.0000 ug | ORAL_TABLET | Freq: Every day | ORAL | Status: DC

## 2015-12-17 ENCOUNTER — Ambulatory Visit: Payer: 59 | Admitting: Physician Assistant

## 2015-12-31 ENCOUNTER — Other Ambulatory Visit: Payer: Self-pay | Admitting: Physician Assistant

## 2015-12-31 DIAGNOSIS — E038 Other specified hypothyroidism: Secondary | ICD-10-CM

## 2016-01-13 LAB — BASIC METABOLIC PANEL
BUN: 15 mg/dL (ref 4–21)
CREATININE: 0.9 mg/dL (ref 0.5–1.1)
Glucose: 77 mg/dL
Potassium: 4.5 mmol/L (ref 3.4–5.3)
SODIUM: 139 mmol/L (ref 137–147)

## 2016-01-13 LAB — CBC AND DIFFERENTIAL
HEMATOCRIT: 40 % (ref 36–46)
HEMOGLOBIN: 13.3 g/dL (ref 12.0–16.0)
Neutrophils Absolute: 41 /uL
Platelets: 311 10*3/uL (ref 150–399)
WBC: 5.5 10^3/mL

## 2016-01-13 LAB — TSH: TSH: 0.28 u[IU]/mL — AB (ref 0.41–5.90)

## 2016-01-13 LAB — HEPATIC FUNCTION PANEL
ALK PHOS: 98 U/L (ref 25–125)
ALT: 29 U/L (ref 7–35)
AST: 28 U/L (ref 13–35)
Bilirubin, Total: 0.8 mg/dL

## 2016-01-17 ENCOUNTER — Encounter: Payer: Self-pay | Admitting: Physician Assistant

## 2016-01-21 ENCOUNTER — Other Ambulatory Visit: Payer: Self-pay | Admitting: Physician Assistant

## 2016-01-21 ENCOUNTER — Telehealth: Payer: Self-pay | Admitting: *Deleted

## 2016-01-21 MED ORDER — LEVOTHYROXINE SODIUM 125 MCG PO TABS: 125.0000 ug | ORAL_TABLET | Freq: Every day | ORAL | Status: DC

## 2016-01-21 NOTE — Telephone Encounter (Signed)
Pt left vm stating that her rheumatologist checked her lab work & said that her levothyroxine needs to be adjusted.  Her labs are abstracted in.

## 2016-01-21 NOTE — Telephone Encounter (Signed)
Sent levothyroxine daily. Recheck levels in 6 weeks.

## 2016-01-22 NOTE — Telephone Encounter (Signed)
Pt notified of new rx.

## 2016-03-13 ENCOUNTER — Other Ambulatory Visit: Payer: Self-pay

## 2016-03-13 DIAGNOSIS — E038 Other specified hypothyroidism: Secondary | ICD-10-CM

## 2016-03-27 ENCOUNTER — Other Ambulatory Visit: Payer: Self-pay

## 2016-03-27 ENCOUNTER — Other Ambulatory Visit: Payer: Self-pay | Admitting: Physician Assistant

## 2016-03-27 LAB — TSH: TSH: 3.22 mIU/L

## 2016-03-27 MED ORDER — LEVOTHYROXINE SODIUM 125 MCG PO TABS: 125.0000 ug | ORAL_TABLET | Freq: Every day | ORAL | Status: DC

## 2016-10-25 ENCOUNTER — Other Ambulatory Visit: Payer: Self-pay | Admitting: Physician Assistant

## 2016-11-03 ENCOUNTER — Ambulatory Visit (INDEPENDENT_AMBULATORY_CARE_PROVIDER_SITE_OTHER): Payer: 59 | Admitting: Obstetrics & Gynecology

## 2016-11-03 ENCOUNTER — Encounter: Payer: Self-pay | Admitting: Obstetrics & Gynecology

## 2016-11-03 VITALS — BP 121/81 | HR 82 | Ht 66.0 in | Wt 152.0 lb

## 2016-11-03 DIAGNOSIS — Z0000 Encounter for general adult medical examination without abnormal findings: Secondary | ICD-10-CM | POA: Diagnosis not present

## 2016-11-03 DIAGNOSIS — Z01419 Encounter for gynecological examination (general) (routine) without abnormal findings: Secondary | ICD-10-CM | POA: Diagnosis not present

## 2016-11-04 NOTE — Progress Notes (Signed)
Subjective:     Melissa Burch is a 31 y.o. female here for a routine exam.  Current complaints: stopped using birth control.  Open to pregnancy.  Taking PNV.       Gynecologic History Patient's last menstrual period was 10/20/2016. Contraception: none Last Pap: 2016. Results were: normal Last mammogram: n/a.   Obstetric History OB History  Gravida Para Term Preterm AB Living  1 1 1  0 0 1  SAB TAB Ectopic Multiple Live Births  0 0 0 0 1    # Outcome Date GA Lbr Len/2nd Weight Sex Delivery Anes PTL Lv  1 Term 09/19/15 [id number] 06:30 / 03:13 6 lb 11.1 oz (3.035 kg) F Vag-Vacuum EPI  LIV       The following portions of the patient's history were reviewed and updated as appropriate: allergies, current medications, past family history, past medical history, past social history, past surgical history and problem list.  Review of Systems Pertinent items noted in HPI and remainder of comprehensive ROS otherwise negative.    Objective:      Vitals:   11/03/16 1502  BP: 121/81  Pulse: 82  Weight: 152 lb (68.9 kg)  Height: 5\' 6"  (1.676 m)   Vitals:  WNL General appearance: alert, cooperative and no distress  HEENT: Normocephalic, without obvious abnormality, atraumatic Eyes: negative Throat: lips, mucosa, and tongue normal; teeth and gums normal  Respiratory: Clear to auscultation bilaterally  CV: Regular rate and rhythm  Breasts:  Normal appearance, no masses or tenderness, no nipple retraction or dimpling  GI: Soft, non-tender; bowel sounds normal; no masses,  no organomegaly  GU: External Genitalia:  Tanner V, no lesion Urethra:  No prolapse   Vagina: Pink, normal rugae, no blood or discharge  Cervix: No CMT, no lesion  Uterus:  Normal size and contour, non tender  Adnexa: Normal, no masses, non tender  Musculoskeletal: No edema, redness or tenderness in the calves or thighs  Skin: No lesions or rash  Lymphatic: Axillary adenopathy: none     Psychiatric: Normal mood and  behavior        Assessment:    Healthy female exam.    Plan:   1.  Continue PNV 2.  Pap with co testing next year.

## 2016-11-24 ENCOUNTER — Other Ambulatory Visit (INDEPENDENT_AMBULATORY_CARE_PROVIDER_SITE_OTHER): Payer: 59

## 2016-11-24 DIAGNOSIS — O209 Hemorrhage in early pregnancy, unspecified: Secondary | ICD-10-CM | POA: Diagnosis not present

## 2016-11-24 NOTE — Progress Notes (Signed)
Pt called stating that she is pregnant with LMP 10/20/16.  She woke up this morning with some red spotting.  She denies any cramping.  BHCG drawn and will repeat in 48 hrs.

## 2016-11-25 LAB — HCG, QUANTITATIVE, PREGNANCY: hCG, Beta Chain, Quant, S: 8.2 m[IU]/mL — ABNORMAL HIGH

## 2016-11-25 IMAGING — US US OB TRANSVAGINAL
1 series · 14 of 28 positions shown · non-contrast
Comparison: None.

CLINICAL DATA: Pregnant patient with vaginal bleeding for 3 hours.

EXAM:
OBSTETRIC <14 WK US AND TRANSVAGINAL OB US
TECHNIQUE: Both transabdominal and transvaginal ultrasound examinations were
performed for complete evaluation of the gestation as well as the
maternal uterus, adnexal regions, and pelvic cul-de-sac.
Transvaginal technique was performed to assess early pregnancy.

[Series 1: us ob comp less 14 wk · 56 acquisitions, 14 frames shown]
[im 3/56]
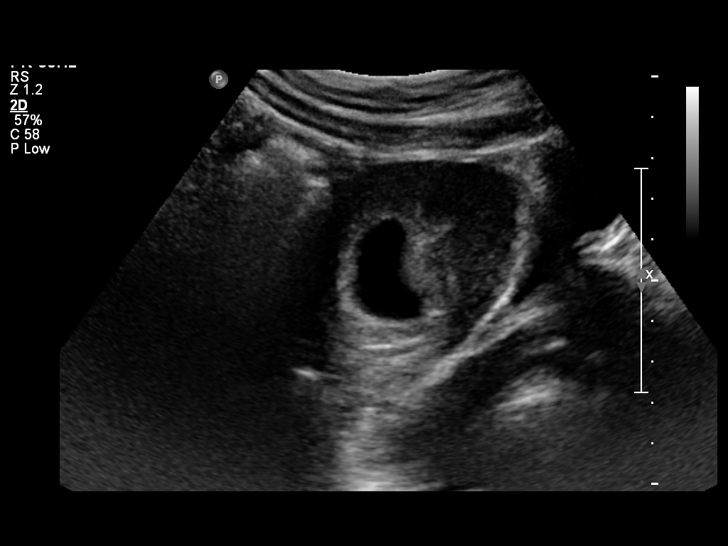
[im 7/56]
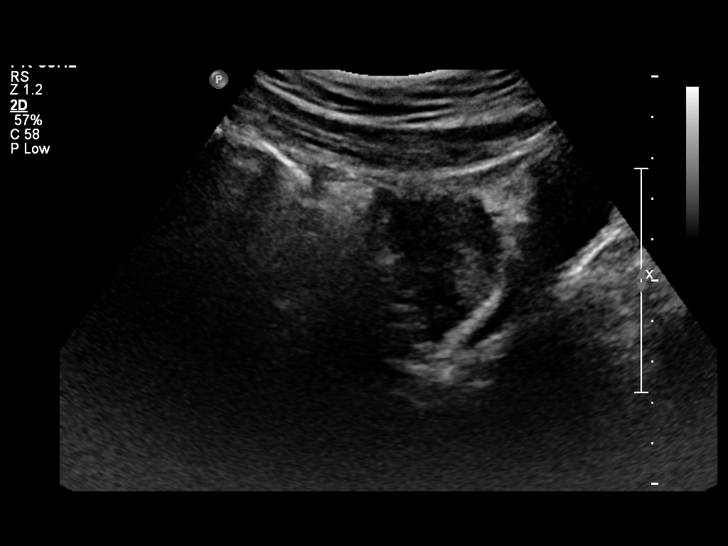
[im 11/56]
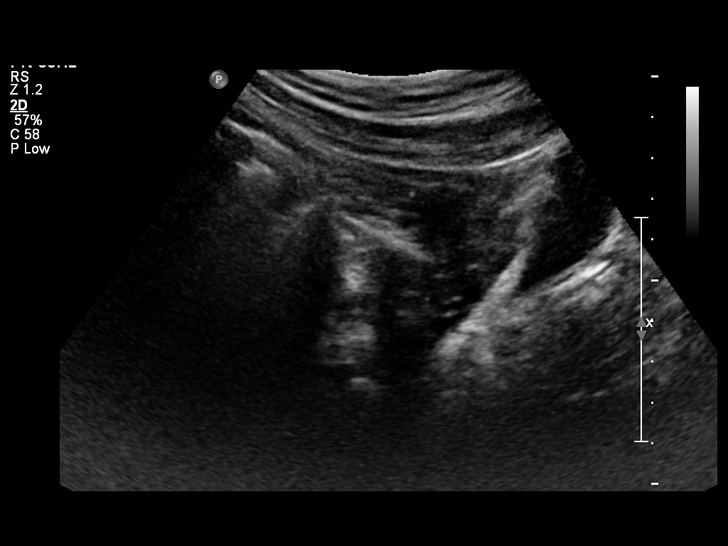
[im 15/56]
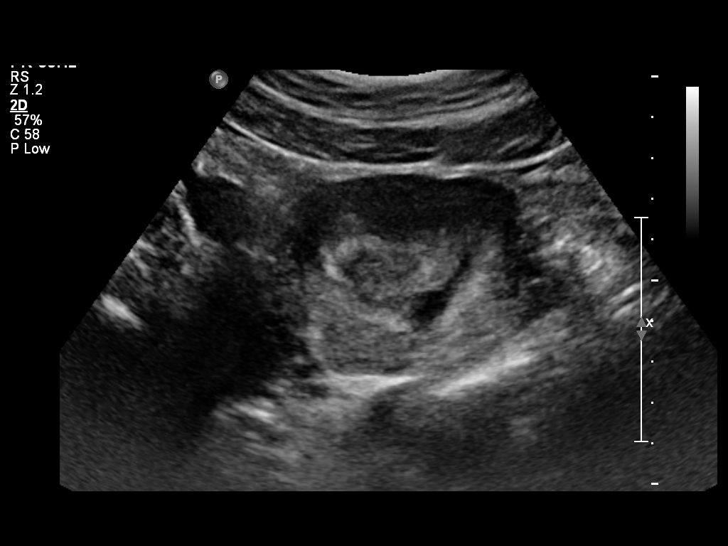
[im 19/56]
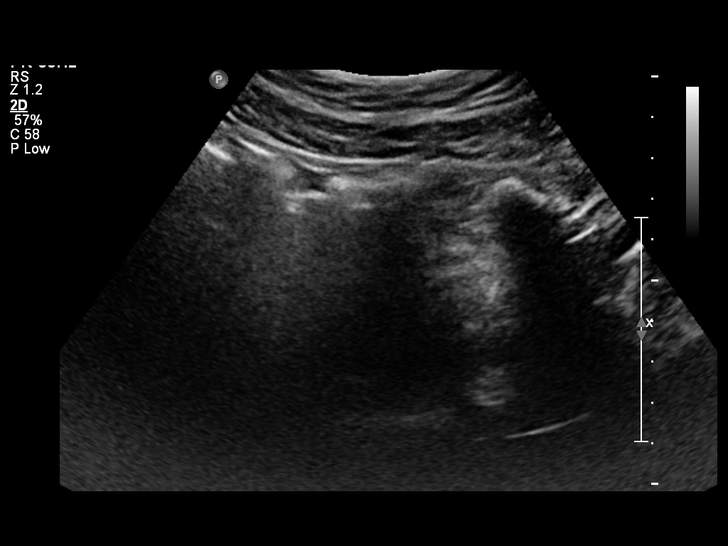
[im 23/56]
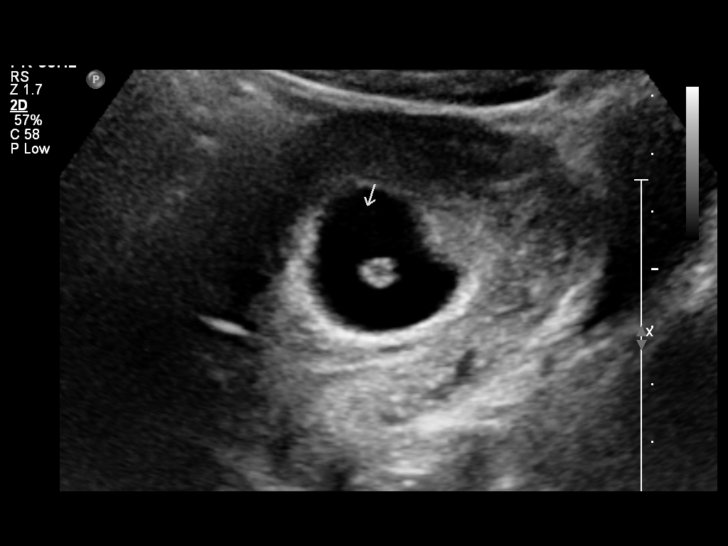
[im 27/56]
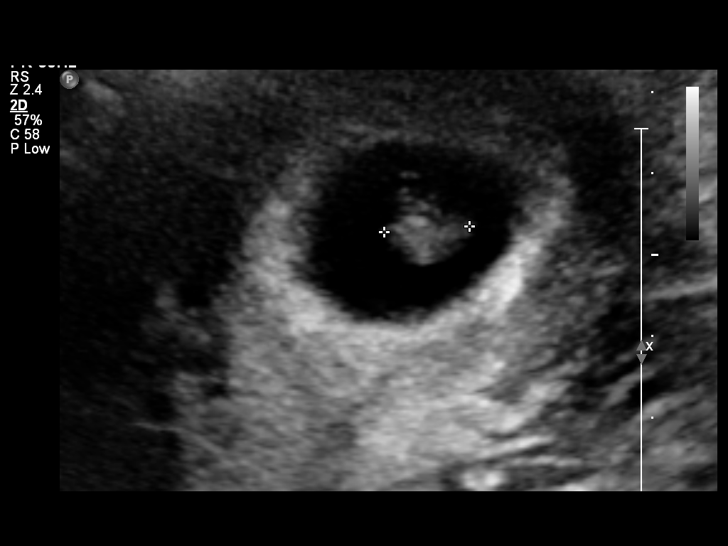
[im 31/56]
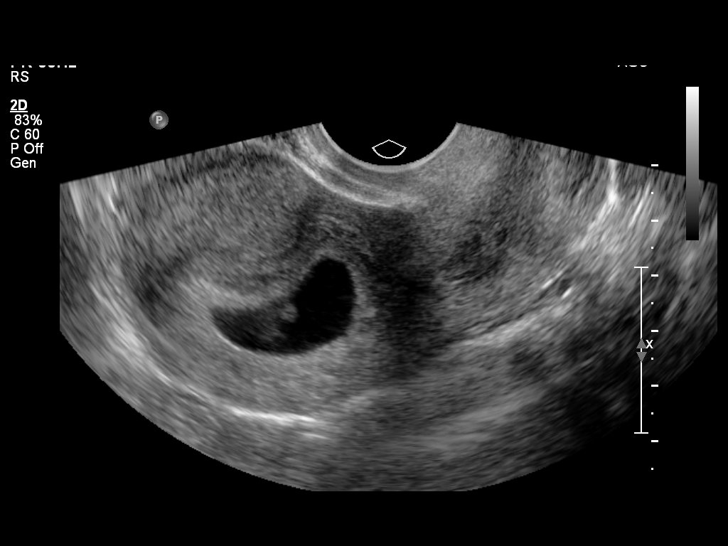
[im 35/56]
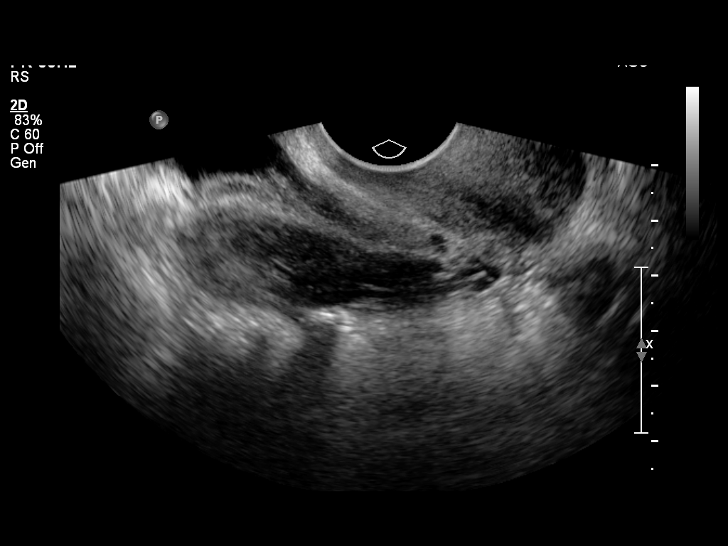
[im 39/56]
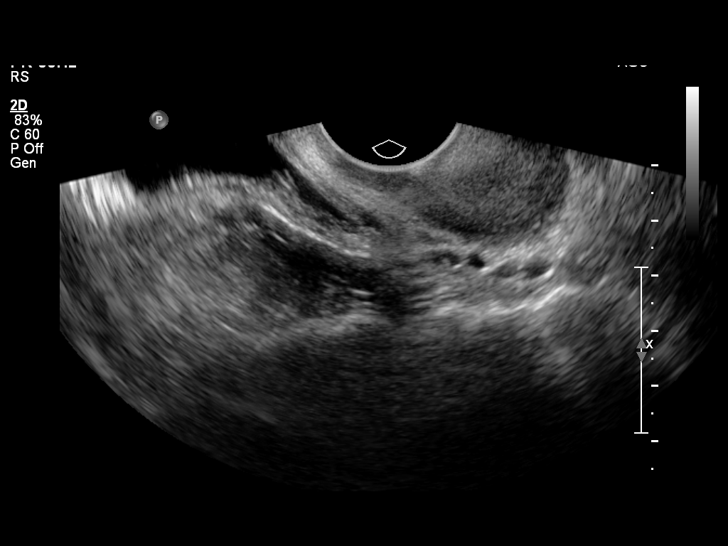
[im 43/56]
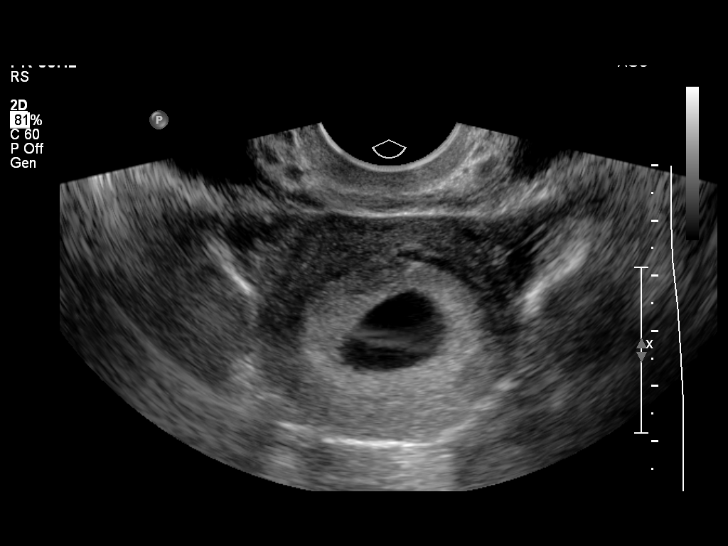
[im 47/56]
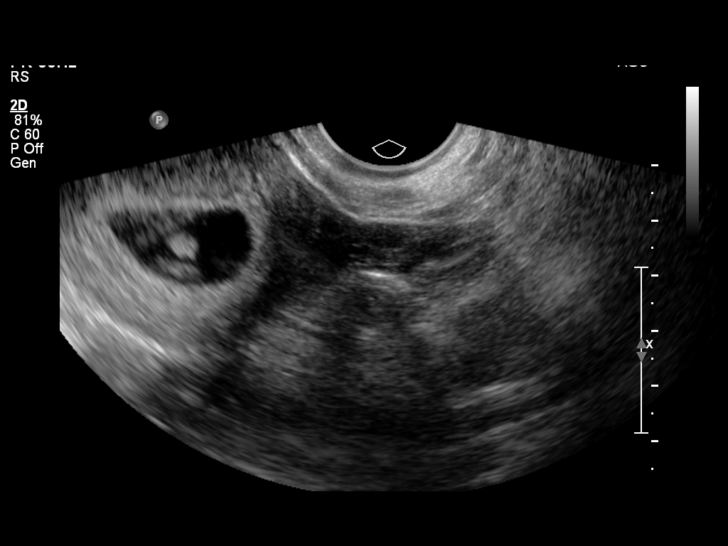
[im 51/56]
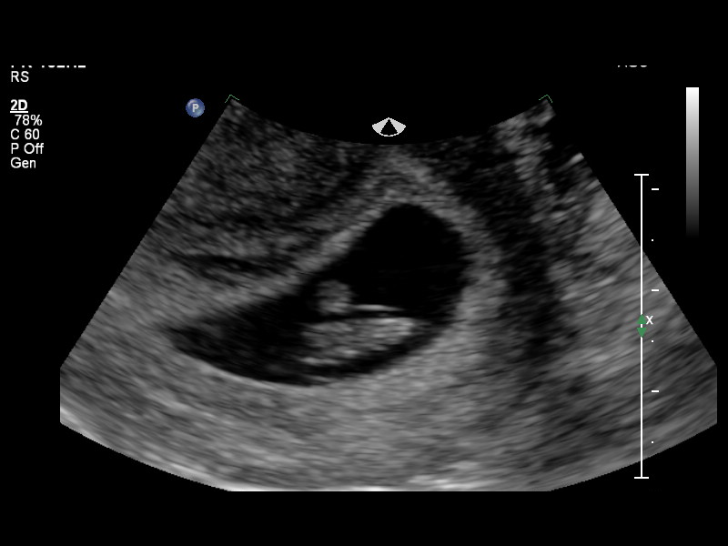
[im 56/56]
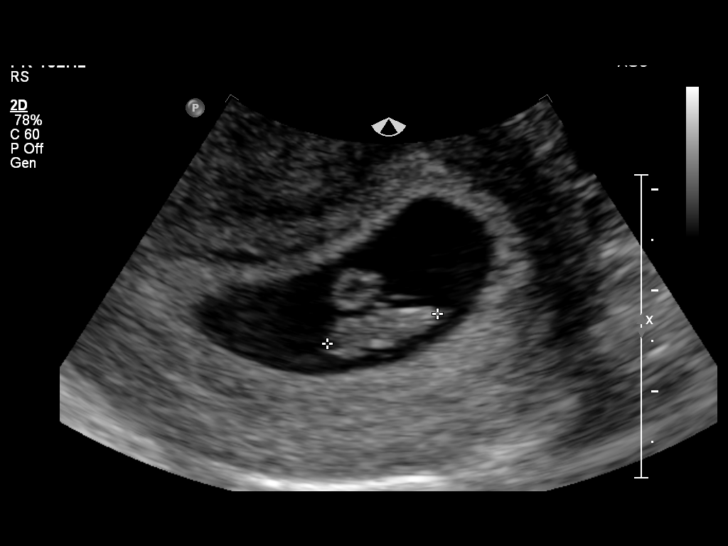

[14 of 28 positions shown; findings below may reference images not displayed]

FINDINGS: Intrauterine gestational sac: Visualized/normal in shape.

Yolk sac:  Present.

Embryo:  Present.

Cardiac Activity: Present.

Heart Rate: 134  bpm

CRL:  11.2  mm   7 w   2 d                  US EDC: 09/21/2015

Maternal uterus/adnexae: Small to moderate subchorionic hemorrhage.
Neither ovary is discretely visualized. There is no free fluid in
the pelvis.
IMPRESSION: Single live intrauterine pregnancy estimated gestational age 7 weeks
2 days for estimated date of delivery 09/21/2015. Small to moderate
subchorionic hemorrhage.

## 2016-11-26 ENCOUNTER — Telehealth: Payer: Self-pay | Admitting: *Deleted

## 2016-11-26 ENCOUNTER — Other Ambulatory Visit: Payer: 59

## 2016-11-26 DIAGNOSIS — O039 Complete or unspecified spontaneous abortion without complication: Secondary | ICD-10-CM

## 2016-11-26 LAB — HCG, QUANTITATIVE, PREGNANCY: hCG, Beta Chain, Quant, S: 3.4 m[IU]/mL

## 2016-11-26 NOTE — Progress Notes (Signed)
Pt here  For repeat BHCG.  Last value on 11/24/16 was 8.  Pt states that she has been bleeding and having cramping.  HCG redrawn today and will send STAT and call pt with results.

## 2016-11-26 NOTE — Telephone Encounter (Signed)
Pt notified of dropping BHCG level now 3.4.  If patient bleeds for more than 10 days she will return to office for further evaluation.  She was instructed to wait at least 1 regular cycle before she attempts another pregnancy.  Pt voices understanding.

## 2016-12-01 ENCOUNTER — Encounter: Payer: Self-pay | Admitting: Physician Assistant

## 2016-12-01 ENCOUNTER — Ambulatory Visit (INDEPENDENT_AMBULATORY_CARE_PROVIDER_SITE_OTHER): Payer: 59 | Admitting: Physician Assistant

## 2016-12-01 VITALS — BP 113/73 | HR 76 | Temp 98.1°F | Ht 66.0 in | Wt 152.0 lb

## 2016-12-01 DIAGNOSIS — E039 Hypothyroidism, unspecified: Secondary | ICD-10-CM | POA: Diagnosis not present

## 2016-12-01 MED ORDER — LEVOTHYROXINE SODIUM 125 MCG PO TABS: 125.0000 ug | ORAL_TABLET | Freq: Every day | ORAL | 1 refills | Status: DC

## 2016-12-01 NOTE — Progress Notes (Signed)
   Subjective:    Patient ID: Melissa HatchetMaggie Burch, female    DOB: Sep 11, 1986, 31 y.o.   MRN: [id number]  HPI Pt is a 31 yo female who presents to the clinic for hypothyroidism follow up. She recently had positive home pregnancy test which last Tuesday ended in miscarriage. She has been out of her thyroid medication for a few weeks. She needs to restart. She has one healthy pregnancy. She denies any suicidal or homicidal thoughts. She is down about pregnancy loss but optimistic for the future.    Review of Systems  All other systems reviewed and are negative.      Objective:   Physical Exam  Constitutional: She is oriented to person, place, and time. She appears well-developed and well-nourished.  HENT:  Head: Normocephalic and atraumatic.  Neck: Normal range of motion. Neck supple. No thyromegaly present.  Cardiovascular: Normal rate, regular rhythm and normal heart sounds.   Pulmonary/Chest: Effort normal and breath sounds normal.  Lymphadenopathy:    She has no cervical adenopathy.  Neurological: She is alert and oriented to person, place, and time.  Psychiatric: She has a normal mood and affect. Her behavior is normal.          Assessment & Plan:  Marland Kitchen.Marland Kitchen.Diagnoses and all orders for this visit:  Hypothyroidism, unspecified type -     TSH -     levothyroxine (SYNTHROID, LEVOTHROID) 125 MCG tablet; Take 1 tablet (125 mcg total) by mouth daily.   Get back on medication for next 4 weeks. Recheck TSH. Will call with any adjustments.   Pt is doing well after miscarriage.   .. [facility] 12/01/2016 08/16/2015 08/09/2015 07/05/2015 04/26/2015  Decreased Interest 1 0 0 0 0  Down, Depressed, Hopeless 1 0 0 0 0  PHQ - 2 Score 2 0 0 0 0

## 2016-12-15 ENCOUNTER — Encounter: Payer: 59 | Admitting: Obstetrics & Gynecology

## 2017-01-28 ENCOUNTER — Other Ambulatory Visit: Payer: Self-pay | Admitting: *Deleted

## 2017-01-28 DIAGNOSIS — E039 Hypothyroidism, unspecified: Secondary | ICD-10-CM

## 2017-01-28 LAB — TSH: TSH: 1.14 mIU/L

## 2017-01-28 MED ORDER — LEVOTHYROXINE SODIUM 125 MCG PO TABS: 125.0000 ug | ORAL_TABLET | Freq: Every day | ORAL | 3 refills | Status: DC

## 2017-01-29 ENCOUNTER — Ambulatory Visit (INDEPENDENT_AMBULATORY_CARE_PROVIDER_SITE_OTHER): Payer: 59 | Admitting: Physician Assistant

## 2017-01-29 VITALS — BP 108/69 | HR 65 | Temp 98.0°F | Wt 154.0 lb

## 2017-01-29 DIAGNOSIS — L237 Allergic contact dermatitis due to plants, except food: Secondary | ICD-10-CM | POA: Diagnosis not present

## 2017-01-29 MED ORDER — TRIAMCINOLONE ACETONIDE 0.5 % EX OINT: 1.0000 "application " | TOPICAL_OINTMENT | Freq: Two times a day (BID) | CUTANEOUS | 0 refills | Status: DC

## 2017-01-29 MED ORDER — PREDNISONE 10 MG (21) PO TBPK: ORAL_TABLET | ORAL | 0 refills | Status: DC

## 2017-01-29 MED ORDER — HYDROXYZINE HCL 25 MG PO TABS: 50.0000 mg | ORAL_TABLET | Freq: Every evening | ORAL | 0 refills | Status: DC | PRN

## 2017-01-29 MED ORDER — PREDNISONE 10 MG (48) PO TBPK: ORAL_TABLET | Freq: Every day | ORAL | 0 refills | Status: DC

## 2017-01-29 NOTE — Patient Instructions (Addendum)
- Prednisone steroid taper (follow instructions on package) - Triamcinolone 0.5% ointment on trunk and arms/extremities (DO NOT apply to face) - OTC Hydrocortisone cream on face - Hydroxyzine 1 tablet at bedtime for itching   Poison Ivy Dermatitis Poison ivy dermatitis is inflammation of the skin that is caused by the allergens on the leaves of the poison ivy plant. The skin reaction often involves redness, swelling, blisters, and extreme itching. What are the causes? This condition is caused by a specific chemical (urushiol) found in the sap of the poison ivy plant. This chemical is sticky and can be easily spread to people, animals, and objects. You can get poison ivy dermatitis by:  Having direct contact with a poison ivy plant.  Touching animals, other people, or objects that have come in contact with poison ivy and have the chemical on them. What increases the risk? This condition is more likely to develop in:  People who are outdoors often.  People who go outdoors without wearing protective clothing, such as closed shoes, long pants, and a long-sleeved shirt. What are the signs or symptoms? Symptoms of this condition include:  Redness and itching.  A rash that often includes bumps and blisters. The rash usually appears 48 hours after exposure.  Swelling. This may occur if the reaction is more severe. Symptoms usually last for 1-2 weeks. However, the first time you develop this condition, symptoms may last 3-4 weeks. How is this diagnosed? This condition may be diagnosed based on your symptoms and a physical exam. Your health care provider may also ask you about any recent outdoor activity. How is this treated? Treatment for this condition will vary depending on how severe it is. Treatment may include:  Hydrocortisone creams or calamine lotions to relieve itching.  Oatmeal baths to soothe the skin.  Over-the-counter antihistamine tablets.  Oral steroid medicine for more  severe outbreaks. Follow these instructions at home:  Take or apply over-the-counter and prescription medicines only as told by your health care provider.  Wash exposed skin as soon as possible with soap and cold water.  Use hydrocortisone creams or calamine lotion as needed to soothe the skin and relieve itching.  Take oatmeal baths as needed. Use colloidal oatmeal. You can get this at your local pharmacy or grocery store. Follow the instructions on the packaging.  Do not scratch or rub your skin.  While you have the rash, wash clothes right after you wear them. How is this prevented?  Learn to identify the poison ivy plant and avoid contact with the plant. This plant can be recognized by the number of leaves. Generally, poison ivy has three leaves with flowering branches on a single stem. The leaves are typically glossy, and they have jagged edges that come to a point at the front.  If you have been exposed to poison ivy, thoroughly wash with soap and water right away. You have about 30 minutes to remove the plant resin before it will cause the rash. Be sure to wash under your fingernails because any plant resin there will continue to spread the rash.  When hiking or camping, wear clothes that will help you to avoid exposure on the skin. This includes long pants, a long-sleeved shirt, tall socks, and hiking boots. You can also apply preventive lotion to your skin to help limit exposure.  If you suspect that your clothes or outdoor gear came in contact with poison ivy, rinse them off outside with a garden hose before you bring them  inside your house. Contact a health care provider if:  You have open sores in the rash area.  You have more redness, swelling, or pain in the affected area.  You have redness that spreads beyond the rash area.  You have fluid, blood, or pus coming from the affected area.  You have a fever.  You have a rash over a large area of your body.  You have a  rash on your eyes, mouth, or genitals.  Your rash does not improve after a few days. Get help right away if:  Your face swells or your eyes swell shut.  You have trouble breathing.  You have trouble swallowing. This information is not intended to replace advice given to you by your health care provider. Make sure you discuss any questions you have with your health care provider. Document Released: 09/04/2000 Document Revised: 02/13/2016 Document Reviewed: 02/13/2015 Elsevier Interactive Patient Education  2017 [employer]

## 2017-01-29 NOTE — Progress Notes (Signed)
HPI:                                                                Melissa HatchetMaggie Aiello is a 31 y.o. female who [facility] today for rash  Onset: 1 week ago Location: started on right hand and wrist, has spread to trunk and face Duration: constant Character: itching, burning Aggravating factors / Triggers: none Treatments tried: Tecnu cream  Patient reports she has been gardening recently  Recent illness / systemic symptoms: none  Medication / drug exposure: none Recent travel: none Animal/insect exposure: none Sexual contacts: no new contacts History of allergies: yes, poison ivy Exposure to new soaps, perfumes, cleaning products: none Exposure to chemicals: none  Past Medical History:  Diagnosis Date  . Lupus   . Thyroid disease   . Vitamin D deficiency    Past Surgical History:  Procedure Laterality Date  . WISDOM TOOTH EXTRACTION     Social History  Substance Use Topics  . Smoking status: Never Smoker  . Smokeless tobacco: Never Used  . Alcohol use Yes     Comment: not while pregnant   family history includes Diabetes in her father and paternal grandfather; Heart attack in her paternal grandmother; Hyperlipidemia in her mother; Stroke in her paternal grandmother.  ROS: negative except as noted in the HPI  Medications: Current Outpatient Prescriptions  Medication Sig Dispense Refill  . docusate sodium (COLACE) 100 MG capsule Take 1 capsule (100 mg total) by mouth at bedtime. (Patient not taking: Reported on 11/03/2016) 10 capsule 0  . hydrocortisone-pramoxine (PROCTOFOAM HC) rectal foam Place 1 applicator rectally 2 (two) times daily. 10 g 0  . hydroxychloroquine (PLAQUENIL) 200 MG tablet Take 200 mg by mouth 2 (two) times daily. Reported on 09/13/2015    . levothyroxine (SYNTHROID, LEVOTHROID) 125 MCG tablet Take 1 tablet (125 mcg total) by mouth daily. 90 tablet 3  . norethindrone  (MICRONOR,CAMILA,ERRIN) 0.35 MG tablet Take 1 tablet (0.35 mg total) by mouth daily. (Patient not taking: Reported on 10/28/2015) 1 Package 11  . Prenatal Vit-Fe Fumarate-FA (PRENATAL MULTIVITAMIN) TABS tablet Take 1 tablet by mouth daily at 12 noon.     No current facility-administered medications for this visit.    No Known Allergies     Objective:  BP 108/69   Pulse 65   Temp 98 F (36.7 C) (Oral)   Wt 154 lb (69.9 kg)   BMI 24.86 kg/m  Gen: well-groomed, cooperative, not ill-appearing, no distress HEENT: normal conjunctiva, oropharynx clear, moist mucus membranes neck supple, trachea midline Pulm: Normal work of breathing, normal phonation Neuro: alert and oriented x 3, EOM's intact, no tremor MSK: moving all extremities, normal gait and station, no peripheral edema Skin: warm, dry, intact; erythematous papules and plaques in linear distribution on right flank and bilateral upper extremities, diffuse erythema of bilateral cheeks to the lower lids margins    No results found.  Assessment and Plan: 31 y.o. female with   1. Allergic contact dermatitis due to plants, except food - triamcinolone ointment (KENALOG) 0.5 %; Apply 1 application topically 2 (two) times daily. To affected area, avoid eyes and face  Dispense: 30 g; Refill: 0 - hydrOXYzine (ATARAX/VISTARIL) 25 MG tablet; Take 2 tablets (  50 mg total) by mouth at bedtime and may repeat dose one time if needed. For itching.  Dispense: 30 tablet; Refill: 0 - predniSONE (STERAPRED UNI-PAK 21 TAB) 10 MG (21) TBPK tablet; Follow steroid taper on package  Dispense: 21 tablet; Refill: 0  Patient education and anticipatory guidance given Patient agrees with treatment plan Follow-up as needed if symptoms worsen or fail to improve  Levonne Hubert PA-C

## 2017-02-05 ENCOUNTER — Other Ambulatory Visit: Payer: Self-pay | Admitting: Physician Assistant

## 2017-02-05 ENCOUNTER — Encounter: Payer: Self-pay | Admitting: Physician Assistant

## 2017-02-05 DIAGNOSIS — L299 Pruritus, unspecified: Secondary | ICD-10-CM | POA: Insufficient documentation

## 2017-02-05 DIAGNOSIS — L237 Allergic contact dermatitis due to plants, except food: Secondary | ICD-10-CM

## 2017-02-05 NOTE — Progress Notes (Signed)
Patient reports full-body pruritis without a rash for the last 2-3 days. Patient recently completed prednisone taper for poison ivy dermatitis, but states this itch is different. It is not relieved by cream/lotion.  Patient instructed to go to lab for CBC/CMP, take Claritin, schedule follow-up appointment for Monday and go to urgent care if needed over the weekend.

## 2017-02-05 NOTE — Telephone Encounter (Signed)
Patient reports full-body pruritis without a rash for the last 2-3 days. Patient recently completed prednisone taper for poison ivy dermatitis, but states this itch is different. It is not relieved by cream/lotion.  Patient instructed to go to lab for CBC/CMP, take Claritin, schedule follow-up appointment for Monday and go to urgent care if needed over the weekend. 

## 2017-03-15 ENCOUNTER — Encounter: Payer: 59 | Admitting: Advanced Practice Midwife

## 2017-03-15 ENCOUNTER — Ambulatory Visit (INDEPENDENT_AMBULATORY_CARE_PROVIDER_SITE_OTHER): Payer: 59 | Admitting: Advanced Practice Midwife

## 2017-03-15 ENCOUNTER — Encounter: Payer: Self-pay | Admitting: Advanced Practice Midwife

## 2017-03-15 VITALS — BP 116/62 | HR 79 | Wt 157.0 lb

## 2017-03-15 DIAGNOSIS — O0991 Supervision of high risk pregnancy, unspecified, first trimester: Secondary | ICD-10-CM | POA: Diagnosis not present

## 2017-03-15 DIAGNOSIS — Z3689 Encounter for other specified antenatal screening: Secondary | ICD-10-CM | POA: Diagnosis not present

## 2017-03-15 DIAGNOSIS — O99351 Diseases of the nervous system complicating pregnancy, first trimester: Secondary | ICD-10-CM

## 2017-03-15 DIAGNOSIS — O0990 Supervision of high risk pregnancy, unspecified, unspecified trimester: Secondary | ICD-10-CM

## 2017-03-15 DIAGNOSIS — M329 Systemic lupus erythematosus, unspecified: Secondary | ICD-10-CM

## 2017-03-15 DIAGNOSIS — E038 Other specified hypothyroidism: Secondary | ICD-10-CM

## 2017-03-15 DIAGNOSIS — Z113 Encounter for screening for infections with a predominantly sexual mode of transmission: Secondary | ICD-10-CM | POA: Diagnosis not present

## 2017-03-15 LAB — COMPREHENSIVE METABOLIC PANEL
ALT: 18 U/L (ref 6–29)
AST: 20 U/L (ref 10–30)
Albumin: 4.2 g/dL (ref 3.6–5.1)
Alkaline Phosphatase: 63 U/L (ref 33–115)
BUN: 10 mg/dL (ref 7–25)
CHLORIDE: 102 mmol/L (ref 98–110)
CO2: 22 mmol/L (ref 20–31)
CREATININE: 0.68 mg/dL (ref 0.50–1.10)
Calcium: 9.6 mg/dL (ref 8.6–10.2)
GLUCOSE: 76 mg/dL (ref 65–99)
Potassium: 4 mmol/L (ref 3.5–5.3)
SODIUM: 133 mmol/L — AB (ref 135–146)
TOTAL PROTEIN: 7.1 g/dL (ref 6.1–8.1)
Total Bilirubin: 0.9 mg/dL (ref 0.2–1.2)

## 2017-03-15 LAB — TSH: TSH: 4.26 mIU/L

## 2017-03-15 LAB — T3, FREE: T3, Free: 2.8 pg/mL (ref 2.3–4.2)

## 2017-03-15 LAB — T4, FREE: Free T4: 1.3 ng/dL (ref 0.8–1.8)

## 2017-03-15 NOTE — Assessment & Plan Note (Signed)
BABYSCRIPTS PATIENT: [x ] initial, [ ] 12, [ ] 20, [ ] 28, [ ] 32, [ ] 36, [ ] 38, [ ] 39, [ ] 40 

## 2017-03-15 NOTE — Patient Instructions (Signed)
First Trimester of Pregnancy The first trimester of pregnancy is from week 1 until the end of week 13 (months 1 through 3). A week after a sperm fertilizes an egg, the egg will implant on the wall of the uterus. This embryo will begin to develop into a baby. Genes from you and your partner will form the baby. The female genes will determine whether the baby will be a boy or a girl. At 6-8 weeks, the eyes and face will be formed, and the heartbeat can be seen on ultrasound. At the end of 12 weeks, all the baby's organs will be formed. Now that you are pregnant, you will want to do everything you can to have a healthy baby. Two of the most important things are to get good prenatal care and to follow your health care provider's instructions. Prenatal care is all the medical care you receive before the baby's birth. This care will help prevent, find, and treat any problems during the pregnancy and childbirth. Body changes during your first trimester Your body goes through many changes during pregnancy. The changes vary from woman to woman.  You may gain or lose a couple of pounds at first.  You may feel sick to your stomach (nauseous) and you may throw up (vomit). If the vomiting is uncontrollable, call your health care provider.  You may tire easily.  You may develop headaches that can be relieved by medicines. All medicines should be approved by your health care provider.  You may urinate more often. Painful urination may mean you have a bladder infection.  You may develop heartburn as a result of your pregnancy.  You may develop constipation because certain hormones are causing the muscles that push stool through your intestines to slow down.  You may develop hemorrhoids or swollen veins (varicose veins).  Your breasts may begin to grow larger and become tender. Your nipples may stick out more, and the tissue that surrounds them (areola) may become darker.  Your gums may bleed and may be  sensitive to brushing and flossing.  Dark spots or blotches (chloasma, mask of pregnancy) may develop on your face. This will likely fade after the baby is born.  Your menstrual periods will stop.  You may have a loss of appetite.  You may develop cravings for certain kinds of food.  You may have changes in your emotions from day to day, such as being excited to be pregnant or being concerned that something may go wrong with the pregnancy and baby.  You may have more vivid and strange dreams.  You may have changes in your hair. These can include thickening of your hair, rapid growth, and changes in texture. Some women also have hair loss during or after pregnancy, or hair that feels dry or thin. Your hair will most likely return to normal after your baby is born.  What to expect at prenatal visits During a routine prenatal visit:  You will be weighed to make sure you and the baby are growing normally.  Your blood pressure will be taken.  Your abdomen will be measured to track your baby's growth.  The fetal heartbeat will be listened to between weeks 10 and 14 of your pregnancy.  Test results from any previous visits will be discussed.  Your health care provider may ask you:  How you are feeling.  If you are feeling the baby move.  If you have had any abnormal symptoms, such as leaking fluid, bleeding, severe headaches,   or abdominal cramping.  If you are using any tobacco products, including cigarettes, chewing tobacco, and electronic cigarettes.  If you have any questions.  Other tests that may be performed during your first trimester include:  Blood tests to find your blood type and to check for the presence of any previous infections. The tests will also be used to check for low iron levels (anemia) and protein on red blood cells (Rh antibodies). Depending on your risk factors, or if you previously had diabetes during pregnancy, you may have tests to check for high blood  sugar that affects pregnant women (gestational diabetes).  Urine tests to check for infections, diabetes, or protein in the urine.  An ultrasound to confirm the proper growth and development of the baby.  Fetal screens for spinal cord problems (spina bifida) and Down syndrome.  HIV (human immunodeficiency virus) testing. Routine prenatal testing includes screening for HIV, unless you choose not to have this test.  You may need other tests to make sure you and the baby are doing well.  Follow these instructions at home: Medicines  Follow your health care provider's instructions regarding medicine use. Specific medicines may be either safe or unsafe to take during pregnancy.  Take a prenatal vitamin that contains at least 600 micrograms (mcg) of folic acid.  If you develop constipation, try taking a stool softener if your health care provider approves. Eating and drinking  Eat a balanced diet that includes fresh fruits and vegetables, whole grains, good sources of protein such as meat, eggs, or tofu, and low-fat dairy. Your health care provider will help you determine the amount of weight gain that is right for you.  Avoid raw meat and uncooked cheese. These carry germs that can cause birth defects in the baby.  Eating four or five small meals rather than three large meals a day may help relieve nausea and vomiting. If you start to feel nauseous, eating a few soda crackers can be helpful. Drinking liquids between meals, instead of during meals, also seems to help ease nausea and vomiting.  Limit foods that are high in fat and processed sugars, such as fried and sweet foods.  To prevent constipation: ? Eat foods that are high in fiber, such as fresh fruits and vegetables, whole grains, and beans. ? Drink enough fluid to keep your urine clear or pale yellow. Activity  Exercise only as directed by your health care provider. Most women can continue their usual exercise routine during  pregnancy. Try to exercise for 30 minutes at least 5 days a week. Exercising will help you: ? Control your weight. ? Stay in shape. ? Be prepared for labor and delivery.  Experiencing pain or cramping in the lower abdomen or lower back is a good sign that you should stop exercising. Check with your health care provider before continuing with normal exercises.  Try to avoid standing for long periods of time. Move your legs often if you must stand in one place for a long time.  Avoid heavy lifting.  Wear low-heeled shoes and practice good posture.  You may continue to have sex unless your health care provider tells you not to. Relieving pain and discomfort  Wear a good support bra to relieve breast tenderness.  Take warm sitz baths to soothe any pain or discomfort caused by hemorrhoids. Use hemorrhoid cream if your health care provider approves.  Rest with your legs elevated if you have leg cramps or low back pain.  If you develop   varicose veins in your legs, wear support hose. Elevate your feet for 15 minutes, 3-4 times a day. Limit salt in your diet. Prenatal care  Schedule your prenatal visits by the twelfth week of pregnancy. They are usually scheduled monthly at first, then more often in the last 2 months before delivery.  Write down your questions. Take them to your prenatal visits.  Keep all your prenatal visits as told by your health care provider. This is important. Safety  Wear your seat belt at all times when driving.  Make a list of emergency phone numbers, including numbers for family, friends, the hospital, and police and fire departments. General instructions  Ask your health care provider for a referral to a local prenatal education class. Begin classes no later than the beginning of month 6 of your pregnancy.  Ask for help if you have counseling or nutritional needs during pregnancy. Your health care provider can offer advice or refer you to specialists for help  with various needs.  Do not use hot tubs, steam rooms, or saunas.  Do not douche or use tampons or scented sanitary pads.  Do not cross your legs for long periods of time.  Avoid cat litter boxes and soil used by cats. These carry germs that can cause birth defects in the baby and possibly loss of the fetus by miscarriage or stillbirth.  Avoid all smoking, herbs, alcohol, and medicines not prescribed by your health care provider. Chemicals in these products affect the formation and growth of the baby.  Do not use any products that contain nicotine or tobacco, such as cigarettes and e-cigarettes. If you need help quitting, ask your health care provider. You may receive counseling support and other resources to help you quit.  Schedule a dentist appointment. At home, brush your teeth with a soft toothbrush and be gentle when you floss. Contact a health care provider if:  You have dizziness.  You have mild pelvic cramps, pelvic pressure, or nagging pain in the abdominal area.  You have persistent nausea, vomiting, or diarrhea.  You have a bad smelling vaginal discharge.  You have pain when you urinate.  You notice increased swelling in your face, hands, legs, or ankles.  You are exposed to fifth disease or chickenpox.  You are exposed to German measles (rubella) and have never had it. Get help right away if:  You have a fever.  You are leaking fluid from your vagina.  You have spotting or bleeding from your vagina.  You have severe abdominal cramping or pain.  You have rapid weight gain or loss.  You vomit blood or material that looks like coffee grounds.  You develop a severe headache.  You have shortness of breath.  You have any kind of trauma, such as from a fall or a car accident. Summary  The first trimester of pregnancy is from week 1 until the end of week 13 (months 1 through 3).  Your body goes through many changes during pregnancy. The changes vary from  woman to woman.  You will have routine prenatal visits. During those visits, your health care provider will examine you, discuss any test results you may have, and talk with you about how you are feeling. This information is not intended to replace advice given to you by your health care provider. Make sure you discuss any questions you have with your health care provider. Document Released: 09/01/2001 Document Revised: 08/19/2016 Document Reviewed: 08/19/2016 Elsevier Interactive Patient Education  2017 Elsevier   Inc.  

## 2017-03-15 NOTE — Progress Notes (Signed)
  Subjective:    Melissa Burch is a [id number] being seen today for her first obstetrical visit.  Her obstetrical history is significant for Lupus, hypothyroidism. Patient does intend to breast feed. Pregnancy history fully reviewed.  Patient reports no complaints.  Vitals:   03/15/17 0800  BP: 116/62  Pulse: 79  Weight: 157 lb (71.2 kg)    HISTORY: OB History  Gravida Para Term Preterm AB Living  3 1 1  0 1 1  SAB TAB Ectopic Multiple Live Births  1 0 0 0 1    # Outcome Date GA Lbr Len/2nd Weight Sex Delivery Anes PTL Lv  3 Current           2 Term 09/19/15 [id number] 06:30 / 03:13 6 lb 11.1 oz (3.035 kg) F Vag-Vacuum EPI  LIV  1 SAB              Past Medical History:  Diagnosis Date  . Lupus   . Thyroid disease   . Vitamin D deficiency    Past Surgical History:  Procedure Laterality Date  . WISDOM TOOTH EXTRACTION     Family History  Problem Relation Age of Onset  . Hyperlipidemia Mother   . Diabetes Father   . Heart attack Paternal Grandmother   . Stroke Paternal Grandmother   . Diabetes Paternal Grandfather      Exam    Uterus:     Pelvic Exam:    Perineum: No Hemorrhoids, Normal Perineum   Vulva: Bartholin's, Urethra, Skene's normal   Vagina:  normal discharge   pH:    Cervix: no cervical motion tenderness   Adnexa: no mass, fullness, tenderness   Bony Pelvis: gynecoid  System: Breast:  normal appearance, no masses or tenderness   Skin: normal coloration and turgor, no rashes    Neurologic: oriented, grossly non-focal   Extremities: normal strength, tone, and muscle mass   HEENT neck supple with midline trachea   Mouth/Teeth mucous membranes moist, pharynx normal without lesions   Neck supple   Cardiovascular: regular rate and rhythm   Respiratory:  appears well, vitals normal, no respiratory distress, acyanotic, normal RR, ear and throat exam is normal, neck free of mass or lymphadenopathy, chest clear, no wheezing, crepitations, rhonchi, normal  symmetric air entry   Abdomen: soft, non-tender; bowel sounds normal; no masses,  no organomegaly   Urinary: urethral meatus normal      Assessment:    Pregnancy: [id number] Patient Active Problem List   Diagnosis Date Noted  . Supervision of high risk pregnancy, antepartum, first trimester 03/15/2017  . Pure hypercholesterolemia 07/25/2013  . IBS (irritable bowel syndrome) 07/25/2013  . Lupus 07/24/2013  . Hypothyroidism 07/24/2013        Plan:     Initial labs drawn. Prenatal vitamins. Problem list reviewed and updated. Genetic Screening discussed First Screen: ordered.  Ultrasound discussed; fetal survey: requested.  Follow up in 4 weeks. 50% of 30 min visit spent on counseling and coordination of care.   Labs ordered per [facility]/Dr Shawnie PonsPratt.  Lupus panels,  Cardiolipin, SSA/SSB, anti-DNA,  and baseline cmet and Pr/Cr ratio.  TSH, T3, T4 Start baby ASA Continue Plaquenil Continue Synthroid, [facility]    Melissa Burch 03/15/2017

## 2017-03-15 NOTE — Progress Notes (Signed)
Bedside U/S today shows IUP with CRL of 21.782mm and FHT of 173 BPM

## 2017-03-16 LAB — PRENATAL PROFILE (SOLSTAS)
Antibody Screen: NEGATIVE
BASOS ABS: 0 {cells}/uL (ref 0–200)
Basophils Relative: 0 %
EOS ABS: 73 {cells}/uL (ref 15–500)
EOS PCT: 1 %
HCT: 37.8 % (ref 35.0–45.0)
HEMOGLOBIN: 12.8 g/dL (ref 11.7–15.5)
HEP B S AG: NEGATIVE
HIV: NONREACTIVE
LYMPHS ABS: 1825 {cells}/uL (ref 850–3900)
Lymphocytes Relative: 25 %
MCH: 31.4 pg (ref 27.0–33.0)
MCHC: 33.9 g/dL (ref 32.0–36.0)
MCV: 92.6 fL (ref 80.0–100.0)
MONO ABS: 657 {cells}/uL (ref 200–950)
MPV: 9.1 fL (ref 7.5–12.5)
Monocytes Relative: 9 %
NEUTROS ABS: 4745 {cells}/uL (ref 1500–7800)
NEUTROS PCT: 65 %
Platelets: 281 10*3/uL (ref 140–400)
RBC: 4.08 MIL/uL (ref 3.80–5.10)
RDW: 12.5 % (ref 11.0–15.0)
RUBELLA: 5.46 {index} — AB (ref <0.90)
Rh Type: POSITIVE
WBC: 7.3 10*3/uL (ref 3.8–10.8)

## 2017-03-16 LAB — CARDIOLIPIN ANTIBODIES, IGG, IGM, IGA
Anticardiolipin IgG: <14 [GPL'U]
Anticardiolipin IgM: <12 [MPL'U]

## 2017-03-16 LAB — SJOGRENS SYNDROME-B EXTRACTABLE NUCLEAR ANTIBODY: SSB (LA) (ENA) ANTIBODY, IGG: NEGATIVE

## 2017-03-16 LAB — SJOGRENS SYNDROME-A EXTRACTABLE NUCLEAR ANTIBODY: SSA (RO) (ENA) ANTIBODY, IGG: NEGATIVE

## 2017-03-16 LAB — GC/CHLAMYDIA PROBE AMP (~~LOC~~) NOT AT ARMC
CHLAMYDIA, DNA PROBE: NEGATIVE
NEISSERIA GONORRHEA: NEGATIVE

## 2017-03-16 LAB — PROTEIN / CREATININE RATIO, URINE

## 2017-03-16 LAB — ANTI-DNA ANTIBODY, DOUBLE-STRANDED: DS DNA AB: 5 [IU]/mL — AB

## 2017-03-16 LAB — CULTURE, OB URINE: ORGANISM ID, BACTERIA: NO GROWTH

## 2017-03-18 LAB — RFX DRVVT SCR W/RFLX CONF 1:1 MIX: DRVVT SCREEN: 35 s (ref <=45)

## 2017-03-18 LAB — RFX PTT-LA W/RFX TO HEX PHASE CONF: PTT-LA Screen: 41 s — ABNORMAL HIGH (ref <=40)

## 2017-03-18 LAB — RFLX HEXAGONAL PHASE CONFIRM: HEXAGONAL PHASE CONFIRM: NEGATIVE

## 2017-03-18 LAB — LUPUS ANTICOAGULANT PANEL

## 2017-04-05 ENCOUNTER — Ambulatory Visit (INDEPENDENT_AMBULATORY_CARE_PROVIDER_SITE_OTHER): Payer: 59 | Admitting: Obstetrics & Gynecology

## 2017-04-05 VITALS — BP 120/73 | HR 74 | Wt 160.0 lb

## 2017-04-05 DIAGNOSIS — O0991 Supervision of high risk pregnancy, unspecified, first trimester: Secondary | ICD-10-CM

## 2017-04-05 DIAGNOSIS — L932 Other local lupus erythematosus: Secondary | ICD-10-CM

## 2017-04-05 DIAGNOSIS — E038 Other specified hypothyroidism: Secondary | ICD-10-CM

## 2017-04-05 NOTE — Progress Notes (Signed)
   PRENATAL VISIT NOTE  Subjective:  Melissa HatchetMaggie Kalis is a 31 y.o. G3P1011 at 3770w4d being seen today for ongoing prenatal care.  She is currently monitored for the following issues for this high-risk pregnancy and has Lupus; Hypothyroidism; Pure hypercholesterolemia; IBS (irritable bowel syndrome); and Supervision of high risk pregnancy, antepartum, first trimester on her problem list.  Patient reports mild nausea.  Contractions: Not present. Vag. Bleeding: None.  Movement: Absent. Denies leaking of fluid.   The following portions of the patient's history were reviewed and updated as appropriate: allergies, current medications, past family history, past medical history, past social history, past surgical history and problem list. Problem list updated.  Objective:   Vitals:   04/05/17 1401  BP: 120/73  Pulse: 74  Weight: 160 lb (72.6 kg)    Fetal Status: Fetal Heart Rate (bpm): 158   Movement: Absent     General:  Alert, oriented and cooperative. Patient is in no acute distress.  Skin: Skin is warm and dry. No rash noted.   Cardiovascular: Normal heart rate noted  Respiratory: Normal respiratory effort, no problems with respiration noted  Abdomen: Soft, gravid, appropriate for gestational age.  Pain/Pressure: Absent     Pelvic: Cervical exam deferred        Extremities: Normal range of motion.  Edema: None  Mental Status:  Normal mood and affect. Normal behavior. Normal judgment and thought content.   Assessment and Plan:  Pregnancy: G3P1011 at 4070w4d  1. Supervision of high risk pregnancy, antepartum, first trimester Has first trimester screening scheduled for next week 2. Other local lupus erythematosus Pt now taking baby ASA on Plaquenil  3. Other specified hypothyroidism Thyroid function tests WNL   Preterm labor symptoms and general obstetric precautions including but not limited to vaginal bleeding, contractions, leaking of fluid and fetal movement were reviewed in detail  with the patient. Please refer to After Visit Summary for other counseling recommendations.  No Follow-up on file.   Willodean Rosenthalarolyn Harraway-Smith, MD

## 2017-04-05 NOTE — Patient Instructions (Signed)
First Trimester of Pregnancy The first trimester of pregnancy is from week 1 until the end of week 13 (months 1 through 3). A week after a sperm fertilizes an egg, the egg will implant on the wall of the uterus. This embryo will begin to develop into a baby. Genes from you and your partner will form the baby. The female genes will determine whether the baby will be a boy or a girl. At 6-8 weeks, the eyes and face will be formed, and the heartbeat can be seen on ultrasound. At the end of 12 weeks, all the baby's organs will be formed. Now that you are pregnant, you will want to do everything you can to have a healthy baby. Two of the most important things are to get good prenatal care and to follow your health care provider's instructions. Prenatal care is all the medical care you receive before the baby's birth. This care will help prevent, find, and treat any problems during the pregnancy and childbirth. Body changes during your first trimester Your body goes through many changes during pregnancy. The changes vary from woman to woman.  You may gain or lose a couple of pounds at first.  You may feel sick to your stomach (nauseous) and you may throw up (vomit). If the vomiting is uncontrollable, call your health care provider.  You may tire easily.  You may develop headaches that can be relieved by medicines. All medicines should be approved by your health care provider.  You may urinate more often. Painful urination may mean you have a bladder infection.  You may develop heartburn as a result of your pregnancy.  You may develop constipation because certain hormones are causing the muscles that push stool through your intestines to slow down.  You may develop hemorrhoids or swollen veins (varicose veins).  Your breasts may begin to grow larger and become tender. Your nipples may stick out more, and the tissue that surrounds them (areola) may become darker.  Your gums may bleed and may be  sensitive to brushing and flossing.  Dark spots or blotches (chloasma, mask of pregnancy) may develop on your face. This will likely fade after the baby is born.  Your menstrual periods will stop.  You may have a loss of appetite.  You may develop cravings for certain kinds of food.  You may have changes in your emotions from day to day, such as being excited to be pregnant or being concerned that something may go wrong with the pregnancy and baby.  You may have more vivid and strange dreams.  You may have changes in your hair. These can include thickening of your hair, rapid growth, and changes in texture. Some women also have hair loss during or after pregnancy, or hair that feels dry or thin. Your hair will most likely return to normal after your baby is born.  What to expect at prenatal visits During a routine prenatal visit:  You will be weighed to make sure you and the baby are growing normally.  Your blood pressure will be taken.  Your abdomen will be measured to track your baby's growth.  The fetal heartbeat will be listened to between weeks 10 and 14 of your pregnancy.  Test results from any previous visits will be discussed.  Your health care provider may ask you:  How you are feeling.  If you are feeling the baby move.  If you have had any abnormal symptoms, such as leaking fluid, bleeding, severe headaches,   or abdominal cramping.  If you are using any tobacco products, including cigarettes, chewing tobacco, and electronic cigarettes.  If you have any questions.  Other tests that may be performed during your first trimester include:  Blood tests to find your blood type and to check for the presence of any previous infections. The tests will also be used to check for low iron levels (anemia) and protein on red blood cells (Rh antibodies). Depending on your risk factors, or if you previously had diabetes during pregnancy, you may have tests to check for high blood  sugar that affects pregnant women (gestational diabetes).  Urine tests to check for infections, diabetes, or protein in the urine.  An ultrasound to confirm the proper growth and development of the baby.  Fetal screens for spinal cord problems (spina bifida) and Down syndrome.  HIV (human immunodeficiency virus) testing. Routine prenatal testing includes screening for HIV, unless you choose not to have this test.  You may need other tests to make sure you and the baby are doing well.  Follow these instructions at home: Medicines  Follow your health care provider's instructions regarding medicine use. Specific medicines may be either safe or unsafe to take during pregnancy.  Take a prenatal vitamin that contains at least 600 micrograms (mcg) of folic acid.  If you develop constipation, try taking a stool softener if your health care provider approves. Eating and drinking  Eat a balanced diet that includes fresh fruits and vegetables, whole grains, good sources of protein such as meat, eggs, or tofu, and low-fat dairy. Your health care provider will help you determine the amount of weight gain that is right for you.  Avoid raw meat and uncooked cheese. These carry germs that can cause birth defects in the baby.  Eating four or five small meals rather than three large meals a day may help relieve nausea and vomiting. If you start to feel nauseous, eating a few soda crackers can be helpful. Drinking liquids between meals, instead of during meals, also seems to help ease nausea and vomiting.  Limit foods that are high in fat and processed sugars, such as fried and sweet foods.  To prevent constipation: ? Eat foods that are high in fiber, such as fresh fruits and vegetables, whole grains, and beans. ? Drink enough fluid to keep your urine clear or pale yellow. Activity  Exercise only as directed by your health care provider. Most women can continue their usual exercise routine during  pregnancy. Try to exercise for 30 minutes at least 5 days a week. Exercising will help you: ? Control your weight. ? Stay in shape. ? Be prepared for labor and delivery.  Experiencing pain or cramping in the lower abdomen or lower back is a good sign that you should stop exercising. Check with your health care provider before continuing with normal exercises.  Try to avoid standing for long periods of time. Move your legs often if you must stand in one place for a long time.  Avoid heavy lifting.  Wear low-heeled shoes and practice good posture.  You may continue to have sex unless your health care provider tells you not to. Relieving pain and discomfort  Wear a good support bra to relieve breast tenderness.  Take warm sitz baths to soothe any pain or discomfort caused by hemorrhoids. Use hemorrhoid cream if your health care provider approves.  Rest with your legs elevated if you have leg cramps or low back pain.  If you develop   varicose veins in your legs, wear support hose. Elevate your feet for 15 minutes, 3-4 times a day. Limit salt in your diet. Prenatal care  Schedule your prenatal visits by the twelfth week of pregnancy. They are usually scheduled monthly at first, then more often in the last 2 months before delivery.  Write down your questions. Take them to your prenatal visits.  Keep all your prenatal visits as told by your health care provider. This is important. Safety  Wear your seat belt at all times when driving.  Make a list of emergency phone numbers, including numbers for family, friends, the hospital, and police and fire departments. General instructions  Ask your health care provider for a referral to a local prenatal education class. Begin classes no later than the beginning of month 6 of your pregnancy.  Ask for help if you have counseling or nutritional needs during pregnancy. Your health care provider can offer advice or refer you to specialists for help  with various needs.  Do not use hot tubs, steam rooms, or saunas.  Do not douche or use tampons or scented sanitary pads.  Do not cross your legs for long periods of time.  Avoid cat litter boxes and soil used by cats. These carry germs that can cause birth defects in the baby and possibly loss of the fetus by miscarriage or stillbirth.  Avoid all smoking, herbs, alcohol, and medicines not prescribed by your health care provider. Chemicals in these products affect the formation and growth of the baby.  Do not use any products that contain nicotine or tobacco, such as cigarettes and e-cigarettes. If you need help quitting, ask your health care provider. You may receive counseling support and other resources to help you quit.  Schedule a dentist appointment. At home, brush your teeth with a soft toothbrush and be gentle when you floss. Contact a health care provider if:  You have dizziness.  You have mild pelvic cramps, pelvic pressure, or nagging pain in the abdominal area.  You have persistent nausea, vomiting, or diarrhea.  You have a bad smelling vaginal discharge.  You have pain when you urinate.  You notice increased swelling in your face, hands, legs, or ankles.  You are exposed to fifth disease or chickenpox.  You are exposed to German measles (rubella) and have never had it. Get help right away if:  You have a fever.  You are leaking fluid from your vagina.  You have spotting or bleeding from your vagina.  You have severe abdominal cramping or pain.  You have rapid weight gain or loss.  You vomit blood or material that looks like coffee grounds.  You develop a severe headache.  You have shortness of breath.  You have any kind of trauma, such as from a fall or a car accident. Summary  The first trimester of pregnancy is from week 1 until the end of week 13 (months 1 through 3).  Your body goes through many changes during pregnancy. The changes vary from  woman to woman.  You will have routine prenatal visits. During those visits, your health care provider will examine you, discuss any test results you may have, and talk with you about how you are feeling. This information is not intended to replace advice given to you by your health care provider. Make sure you discuss any questions you have with your health care provider. Document Released: 09/01/2001 Document Revised: 08/19/2016 Document Reviewed: 08/19/2016 Elsevier Interactive Patient Education  2017 Elsevier   Inc.  

## 2017-04-07 ENCOUNTER — Encounter: Payer: 59 | Admitting: Obstetrics & Gynecology

## 2017-04-14 ENCOUNTER — Ambulatory Visit (HOSPITAL_COMMUNITY)
Admission: RE | Admit: 2017-04-14 | Discharge: 2017-04-14 | Disposition: A | Discharge status: Home / Self Care | Payer: 59 | Source: Ambulatory Visit | Attending: Advanced Practice Midwife | Admitting: Advanced Practice Midwife

## 2017-04-14 ENCOUNTER — Encounter (HOSPITAL_COMMUNITY): Payer: Self-pay

## 2017-04-14 DIAGNOSIS — Z3A12 12 weeks gestation of pregnancy: Secondary | ICD-10-CM | POA: Diagnosis not present

## 2017-04-14 DIAGNOSIS — Z3682 Encounter for antenatal screening for nuchal translucency: Secondary | ICD-10-CM | POA: Diagnosis not present

## 2017-04-14 DIAGNOSIS — O26891 Other specified pregnancy related conditions, first trimester: Secondary | ICD-10-CM | POA: Insufficient documentation

## 2017-04-14 DIAGNOSIS — M329 Systemic lupus erythematosus, unspecified: Secondary | ICD-10-CM | POA: Diagnosis not present

## 2017-04-14 DIAGNOSIS — E079 Disorder of thyroid, unspecified: Secondary | ICD-10-CM | POA: Insufficient documentation

## 2017-04-14 DIAGNOSIS — O0990 Supervision of high risk pregnancy, unspecified, unspecified trimester: Secondary | ICD-10-CM

## 2017-04-14 DIAGNOSIS — O99281 Endocrine, nutritional and metabolic diseases complicating pregnancy, first trimester: Secondary | ICD-10-CM | POA: Diagnosis not present

## 2017-04-19 ENCOUNTER — Encounter: Payer: Self-pay | Admitting: *Deleted

## 2017-04-19 ENCOUNTER — Ambulatory Visit (INDEPENDENT_AMBULATORY_CARE_PROVIDER_SITE_OTHER): Payer: 59 | Admitting: Advanced Practice Midwife

## 2017-04-19 VITALS — BP 108/66 | HR 79 | Wt 160.0 lb

## 2017-04-19 DIAGNOSIS — O0992 Supervision of high risk pregnancy, unspecified, second trimester: Secondary | ICD-10-CM

## 2017-04-19 DIAGNOSIS — M329 Systemic lupus erythematosus, unspecified: Secondary | ICD-10-CM

## 2017-04-19 DIAGNOSIS — O26812 Pregnancy related exhaustion and fatigue, second trimester: Secondary | ICD-10-CM

## 2017-04-19 DIAGNOSIS — O99112 Other diseases of the blood and blood-forming organs and certain disorders involving the immune mechanism complicating pregnancy, second trimester: Secondary | ICD-10-CM

## 2017-04-19 DIAGNOSIS — O0990 Supervision of high risk pregnancy, unspecified, unspecified trimester: Secondary | ICD-10-CM

## 2017-04-19 NOTE — Progress Notes (Signed)
   PRENATAL VISIT NOTE  Subjective:  Melissa Burch is a 31 y.o. G3P1011 at 7843w4d being seen today for ongoing prenatal care.  She is currently monitored for the following issues for this high-risk pregnancy and has Lupus; Hypothyroidism; Pure hypercholesterolemia; IBS (irritable bowel syndrome); and Supervision of high risk pregnancy, antepartum, first trimester on her problem list.  Patient reports fatigue.  Contractions: Not present. Vag. Bleeding: None.  Movement: Absent. Denies leaking of fluid.   The following portions of the patient's history were reviewed and updated as appropriate: allergies, current medications, past family history, past medical history, past social history, past surgical history and problem list. Problem list updated.  Objective:   Vitals:   04/19/17 1057  BP: 108/66  Pulse: 79  Weight: 160 lb (72.6 kg)    Fetal Status: Fetal Heart Rate (bpm): 150   Movement: Absent     General:  Alert, oriented and cooperative. Patient is in no acute distress.  Skin: Skin is warm and dry. No rash noted.   Cardiovascular: Normal heart rate noted  Respiratory: Normal respiratory effort, no problems with respiration noted  Abdomen: Soft, gravid, appropriate for gestational age.  Pain/Pressure: Absent     Pelvic: Cervical exam deferred        Extremities: Normal range of motion.  Edema: None  Mental Status:  Normal mood and affect. Normal behavior. Normal judgment and thought content.   Assessment and Plan:  Pregnancy: G3P1011 at 6243w4d  1. Systemic lupus erythematosus, unspecified SLE type, unspecified organ involvement status (HCC)   2. Supervision of high risk pregnancy, antepartum --Reviewed normal NT/first screen results with pt --Anticipatory guidance about next weeks of pregnancy and upcoming visits  3. Pregnancy related fatigue in second trimester --Repeat pt thyroid labs at pt request. Fatigue from first trimester improved then last 2-3 weeks feeling  sluggish.    Preterm labor symptoms and general obstetric precautions including but not limited to vaginal bleeding, contractions, leaking of fluid and fetal movement were reviewed in detail with the patient. Please refer to After Visit Summary for other counseling recommendations.  No Follow-up on file.   Sharen CounterLisa Leftwich-Kirby, CNM

## 2017-04-19 NOTE — Patient Instructions (Signed)

## 2017-04-19 NOTE — Addendum Note (Signed)
Addended by: Mariel AloeLARK, Kendalyn Cranfield L on: 04/19/2017 12:00 PM   Modules accepted: Orders

## 2017-04-19 NOTE — Progress Notes (Signed)
Wants to discuss Thyroid issues

## 2017-04-20 LAB — T4, FREE: FREE T4: 1.4 ng/dL (ref 0.8–1.8)

## 2017-04-20 LAB — TSH: TSH: 4.22 mIU/L

## 2017-04-20 LAB — T3, FREE: T3, Free: 2.5 pg/mL (ref 2.3–4.2)

## 2017-05-07 ENCOUNTER — Other Ambulatory Visit: Payer: Self-pay

## 2017-05-10 ENCOUNTER — Ambulatory Visit (INDEPENDENT_AMBULATORY_CARE_PROVIDER_SITE_OTHER): Payer: 59 | Admitting: Advanced Practice Midwife

## 2017-05-10 VITALS — BP 111/62 | HR 75 | Wt 163.0 lb

## 2017-05-10 DIAGNOSIS — Z3689 Encounter for other specified antenatal screening: Secondary | ICD-10-CM

## 2017-05-10 DIAGNOSIS — Z3482 Encounter for supervision of other normal pregnancy, second trimester: Secondary | ICD-10-CM

## 2017-05-10 NOTE — Progress Notes (Signed)
   PRENATAL VISIT NOTE  Subjective:  Melissa Burch is a 31 y.o. G3P1011 at [redacted]w[redacted]d being seen today for ongoing prenatal care.  She is currently monitored for the following issues for this low-risk pregnancy and has Lupus; Hypothyroidism; Pure hypercholesterolemia; IBS (irritable bowel syndrome); and Supervision of high risk pregnancy, antepartum, first trimester on her problem list.  Patient reports no complaints.  Contractions: Not present. Vag. Bleeding: None.  Movement: Absent. Denies leaking of fluid.   The following portions of the patient's history were reviewed and updated as appropriate: allergies, current medications, past family history, past medical history, past social history, past surgical history and problem list. Problem list updated.  Objective:   Vitals:   05/10/17 0946  BP: 111/62  Pulse: 75  Weight: 163 lb (73.9 kg)    Fetal Status: Fetal Heart Rate (bpm): 141 Fundal Height: 16 cm Movement: Absent     General:  Alert, oriented and cooperative. Patient is in no acute distress.  Skin: Skin is warm and dry. No rash noted.   Cardiovascular: Normal heart rate noted  Respiratory: Normal respiratory effort, no problems with respiration noted  Abdomen: Soft, gravid, appropriate for gestational age.  Pain/Pressure: Absent     Pelvic: Cervical exam deferred        Extremities: Normal range of motion.  Edema: None  Mental Status:  Normal mood and affect. Normal behavior. Normal judgment and thought content.   Assessment and Plan:  Pregnancy: G3P1011 at [redacted]w[redacted]d  1. Screening, antenatal, for fetal anatomic survey  - [place] MFM OB DETAIL +14 WK; Future - Alpha fetoprotein, maternal  2. Encounter for supervision of other normal pregnancy in second trimester     Preterm labor symptoms and general obstetric precautions including but not limited to vaginal bleeding, contractions, leaking of fluid and fetal movement were reviewed in detail with the patient. Please refer to After  Visit Summary for other counseling recommendations.  Return in about 4 weeks (around 06/07/2017) for [employer].   Wynelle Bourgeois, CNM

## 2017-05-10 NOTE — Patient Instructions (Signed)

## 2017-05-12 LAB — ALPHA FETOPROTEIN, MATERNAL
AFP: 54.8 ng/mL
Curr Gest Age: 16.6 weeks
MOM FOR AFP: 1.58
Open Spina bifida: NEGATIVE

## 2017-06-06 IMAGING — US US MFM OB LIMITED
2 series · 15 of 20 positions shown · non-contrast
Comparison: none

[Series 1: us mfm ob limited · 1 of 1 slices shown (1 of 2)]
[im 1/1]
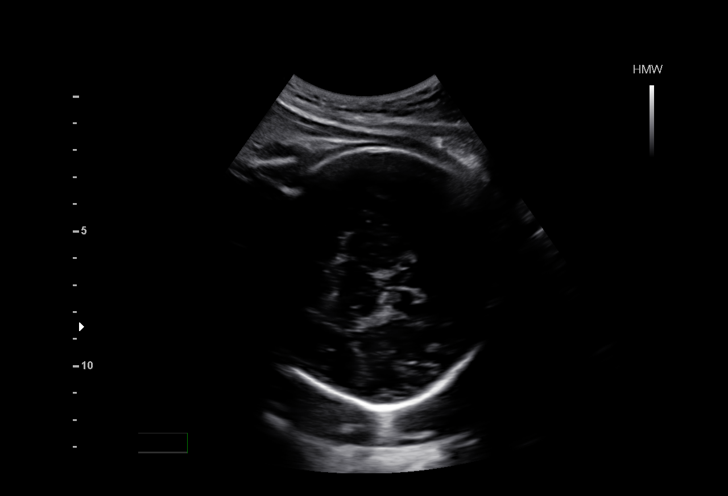

[Series 2: us mfm ob limited · 14 of 19 slices shown (2 of 2)]
[im 2/19]
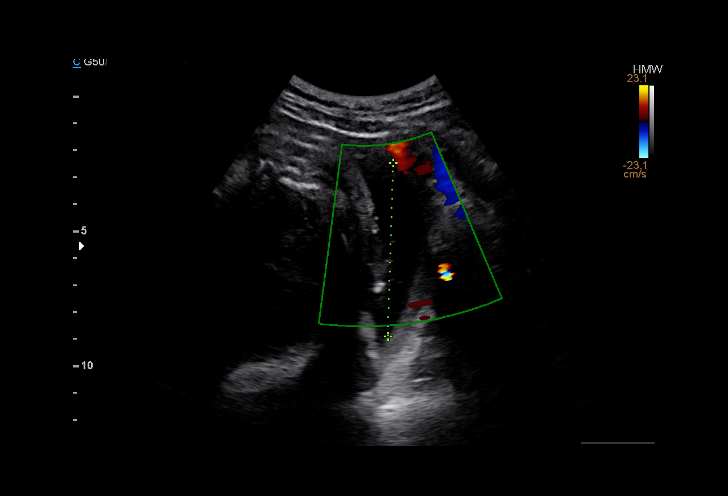
[im 3/19]
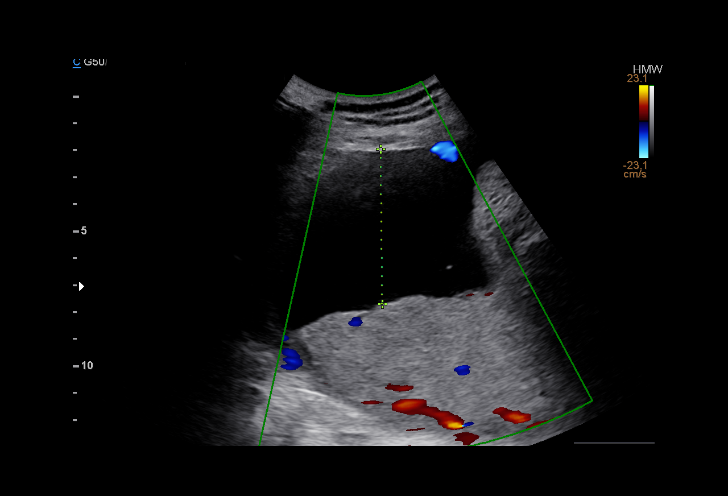
[im 4/19]
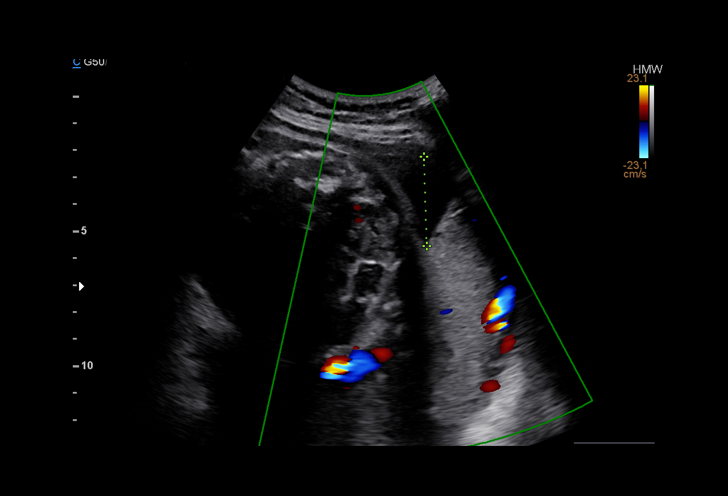
[im 6/19]
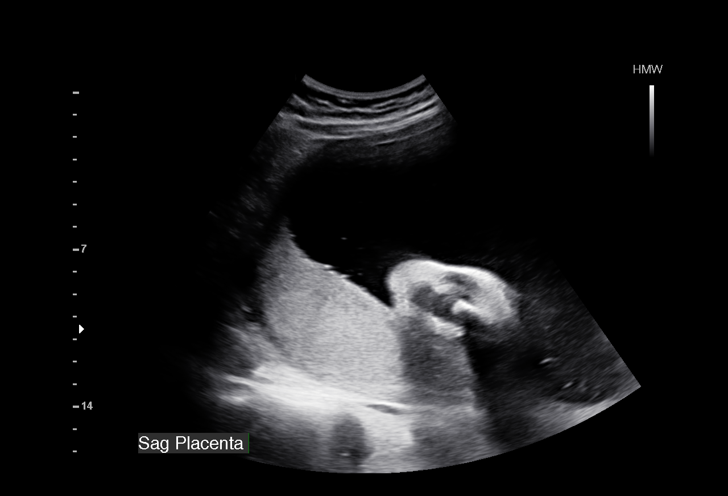
[im 7/19]
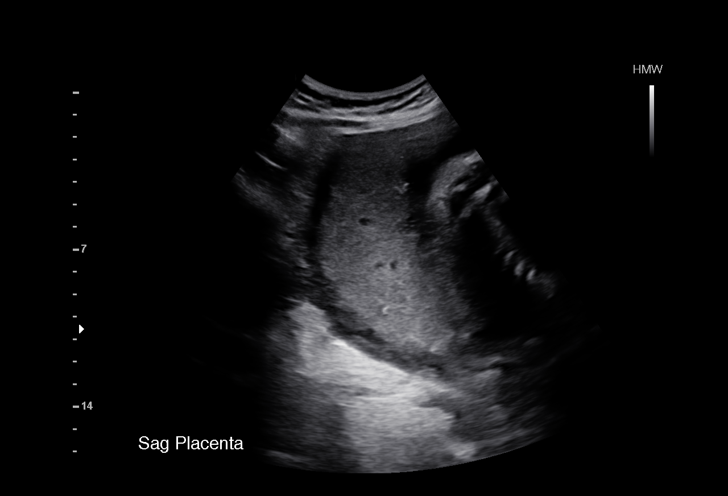
[im 8/19]
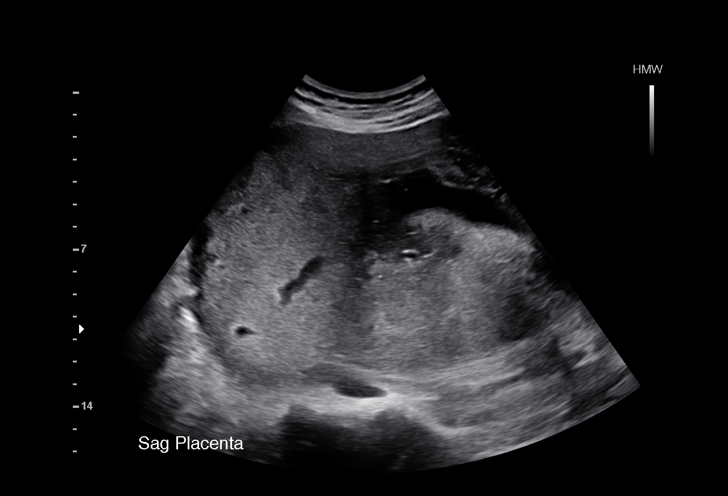
[im 10/19]
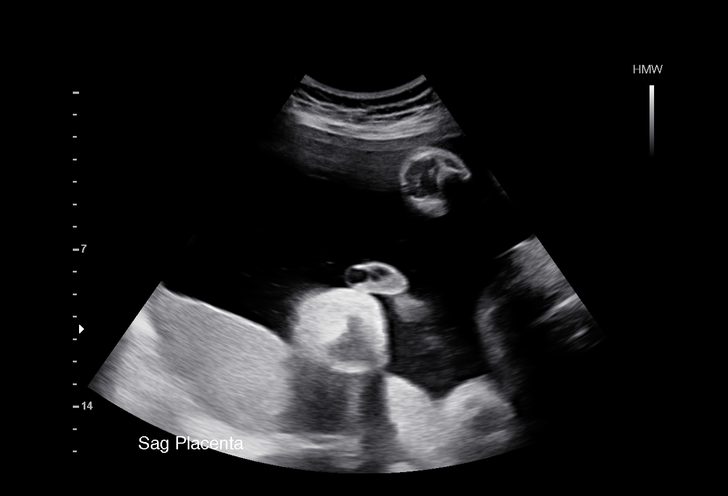
[im 11/19]
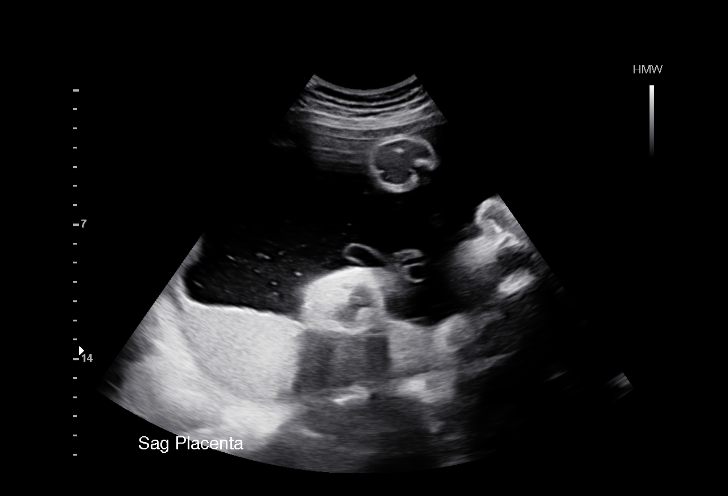
[im 12/19]
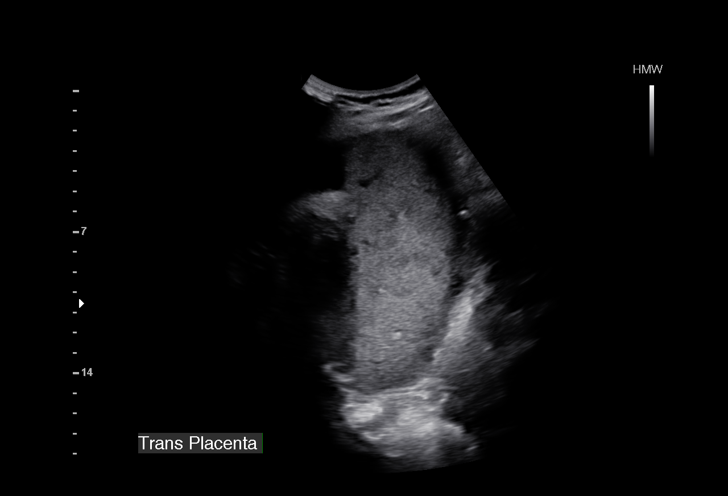
[im 14/19]
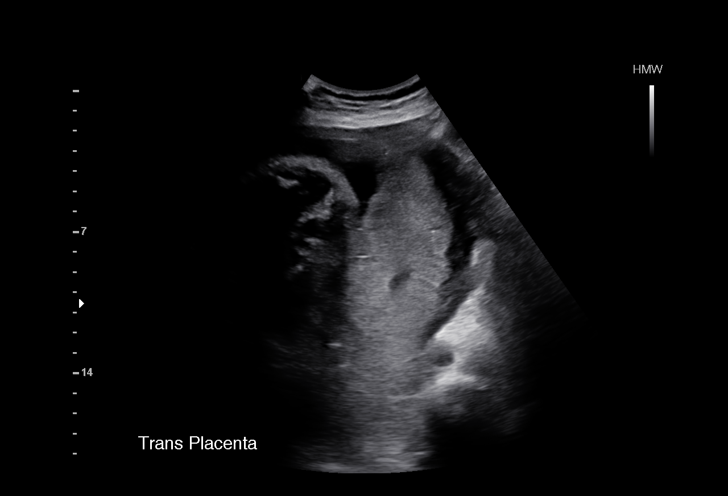
[im 15/19]
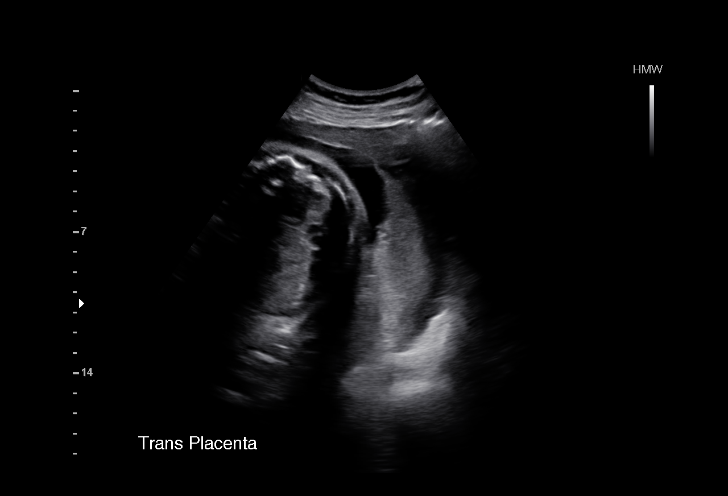
[im 16/19]
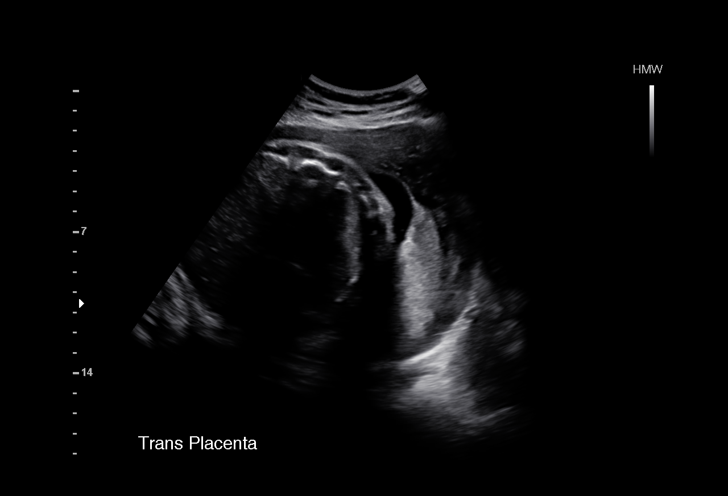
[im 18/19]
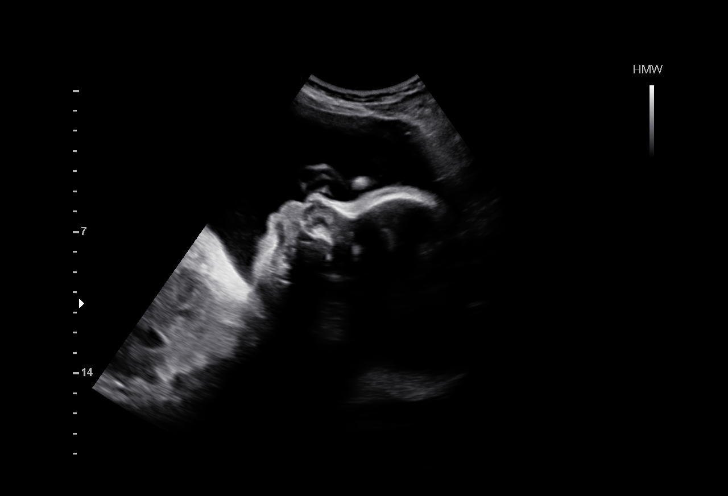
[im 19/19]
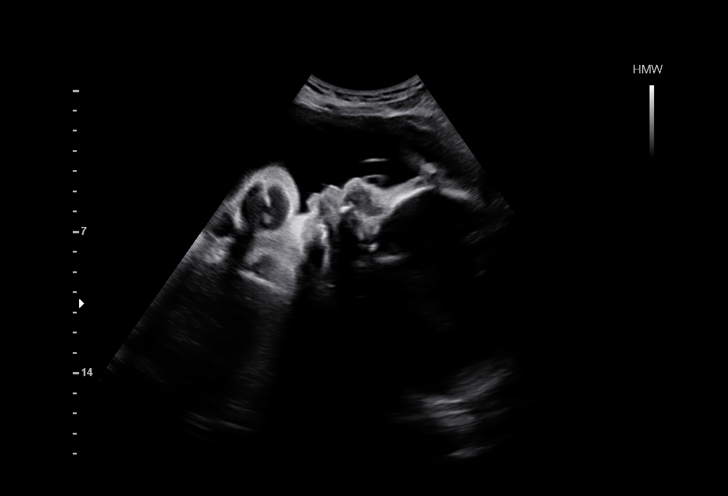

[15 of 20 positions shown; findings below may reference images not displayed]

OBSTETRICS REPORT
(Signed Final 08/16/2015 [DATE])

Name:       POLIN BILLIOT                       Visit  08/16/2015 [DATE]
Date:

Service(s) Provided

US MFM OB LIMITED                                      76815.01
Indications

34 weeks gestation of pregnancy
Hypothyroid (Synthroid)
Systemic lupus complicating pregnancy, third           026.893,
trimester (Plaquenil)
Fetal Evaluation

Num Of             1
Fetuses:
Fetal Heart        125                          bpm
Rate:
Cardiac Activity:  Observed
Presentation:      Cephalic
Placenta:          Posterior, above cervical
os
P. Cord            Previously Visualized
Insertion:

Amniotic Fluid
AFI FV:      Subjectively within normal limits
AFI Sum:     18.74    cm      69  %Tile     Larg Pckt:    6.43   cm
RUQ:   3.27    cm    RLQ:   3.31    cm   LUQ:    6.43    cm   LLQ:    5.73   cm
Gestational Age

LMP:           34w 3d        Date:  12/18/14                  EDD:   09/24/15
Best:          34w 3d    Det. By:   LMP  (12/18/14)           EDD:   09/24/15
Cervix Uterus Adnexa

Cervix:       Not visualized (advanced GA >95wks)
Impression

Single IUP at 34w 3d
Hx of SLE, hypothyroidism
Limited ultrasound performed for amniotic fluid volume
assessment
AFI 18.7 cm
Reactive NST
Normal modified BPP
Recommendations

Continue antenatal testing as scheduled
Ultrasound for growth in 3 weeks

## 2017-06-07 ENCOUNTER — Ambulatory Visit (INDEPENDENT_AMBULATORY_CARE_PROVIDER_SITE_OTHER): Payer: 59 | Admitting: Obstetrics & Gynecology

## 2017-06-07 VITALS — BP 99/56 | HR 68 | Wt 166.0 lb

## 2017-06-07 DIAGNOSIS — E032 Hypothyroidism due to medicaments and other exogenous substances: Secondary | ICD-10-CM

## 2017-06-07 DIAGNOSIS — O0991 Supervision of high risk pregnancy, unspecified, first trimester: Secondary | ICD-10-CM

## 2017-06-07 DIAGNOSIS — L932 Other local lupus erythematosus: Secondary | ICD-10-CM

## 2017-06-07 DIAGNOSIS — Z23 Encounter for immunization: Secondary | ICD-10-CM

## 2017-06-07 NOTE — Progress Notes (Signed)
   PRENATAL VISIT NOTE  Subjective:  Melissa Burch is a 31 y.o. MW  G3P1011 at [redacted]w[redacted]d being seen today for ongoing prenatal care.  She is currently monitored for the following issues for this high-risk pregnancy and has Lupus; Hypothyroidism; Pure hypercholesterolemia; IBS (irritable bowel syndrome); and Supervision of high risk pregnancy, antepartum, first trimester on her problem list.  Patient reports no complaints.  Contractions: Not present. Vag. Bleeding: None.  Movement: Present. Denies leaking of fluid.   The following portions of the patient's history were reviewed and updated as appropriate: allergies, current medications, past family history, past medical history, past social history, past surgical history and problem list. Problem list updated.  Objective:   Vitals:   06/07/17 1426  BP: (!) 99/56  Pulse: 68  Weight: 166 lb (75.3 kg)    Fetal Status: Fetal Heart Rate (bpm): 147   Movement: Present     General:  Alert, oriented and cooperative. Patient is in no acute distress.  Skin: Skin is warm and dry. No rash noted.   Cardiovascular: Normal heart rate noted  Respiratory: Normal respiratory effort, no problems with respiration noted  Abdomen: Soft, gravid, appropriate for gestational age.  Pain/Pressure: Absent     Pelvic: Cervical exam performed        Extremities: Normal range of motion.  Edema: None  Mental Status:  Normal mood and affect. Normal behavior. Normal judgment and thought content.   Assessment and Plan:  Pregnancy: G3P1011 at [redacted]w[redacted]d  1. Supervision of high risk pregnancy, antepartum, first trimester  - Alpha fetoprotein, maternal - Flu Vaccine QUAD 36+ mos IM (Fluarix, Quad PF)  2. Other local lupus erythematosus   3. 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 Please refer to After Visit Summary for other counseling recommendations.  No Follow-up on file.   Allie Bossier, MD

## 2017-06-08 LAB — ALPHA FETOPROTEIN, MATERNAL
AFP MoM: 1.84
AFP, Serum: 103.7 ng/mL
CALC'D GESTATIONAL AGE: 20.6 wk
MATERNAL WT: 166 [lb_av]
Risk for ONTD: 1
Twins-AFP: 1

## 2017-06-08 LAB — TSH: TSH: 3.58 mIU/L

## 2017-06-11 ENCOUNTER — Other Ambulatory Visit (HOSPITAL_COMMUNITY): Payer: Self-pay | Admitting: *Deleted

## 2017-06-11 ENCOUNTER — Encounter (HOSPITAL_COMMUNITY): Payer: Self-pay

## 2017-06-11 ENCOUNTER — Ambulatory Visit (HOSPITAL_COMMUNITY)
Admission: RE | Admit: 2017-06-11 | Discharge: 2017-06-11 | Disposition: A | Discharge status: Home / Self Care | Payer: 59 | Source: Ambulatory Visit | Attending: Advanced Practice Midwife | Admitting: Advanced Practice Midwife

## 2017-06-11 DIAGNOSIS — Z3A21 21 weeks gestation of pregnancy: Secondary | ICD-10-CM | POA: Diagnosis not present

## 2017-06-11 DIAGNOSIS — E079 Disorder of thyroid, unspecified: Secondary | ICD-10-CM | POA: Insufficient documentation

## 2017-06-11 DIAGNOSIS — O26892 Other specified pregnancy related conditions, second trimester: Secondary | ICD-10-CM | POA: Diagnosis present

## 2017-06-11 DIAGNOSIS — O99280 Endocrine, nutritional and metabolic diseases complicating pregnancy, unspecified trimester: Secondary | ICD-10-CM | POA: Diagnosis not present

## 2017-06-11 DIAGNOSIS — M329 Systemic lupus erythematosus, unspecified: Secondary | ICD-10-CM | POA: Diagnosis present

## 2017-06-11 DIAGNOSIS — Z3689 Encounter for other specified antenatal screening: Secondary | ICD-10-CM

## 2017-06-11 DIAGNOSIS — O9989 Other specified diseases and conditions complicating pregnancy, childbirth and the puerperium: Principal | ICD-10-CM

## 2017-07-07 ENCOUNTER — Encounter: Payer: Self-pay | Admitting: Advanced Practice Midwife

## 2017-07-07 ENCOUNTER — Ambulatory Visit (INDEPENDENT_AMBULATORY_CARE_PROVIDER_SITE_OTHER): Payer: 59 | Admitting: Advanced Practice Midwife

## 2017-07-07 DIAGNOSIS — E032 Hypothyroidism due to medicaments and other exogenous substances: Secondary | ICD-10-CM

## 2017-07-07 NOTE — Progress Notes (Signed)
   PRENATAL VISIT NOTE  Subjective:  Melissa Burch is a 31 y.o. G3P1011 at 4532w6d being seen today for ongoing prenatal care.  She is currently monitored for the following issues for this high-risk pregnancy and has Lupus; Hypothyroidism; Pure hypercholesterolemia; IBS (irritable bowel syndrome); and Supervision of high risk pregnancy, antepartum, first trimester on her problem list.  Patient reports no complaints.  Contractions: Not present. Vag. Bleeding: None.  Movement: Present. Denies leaking of fluid.   The following portions of the patient's history were reviewed and updated as appropriate: allergies, current medications, past family history, past medical history, past social history, past surgical history and problem list. Problem list updated.  Objective:   Vitals:   07/07/17 0927  BP: (!) 99/56  Pulse: 79  Weight: 170 lb (77.1 kg)    Fetal Status: Fetal Heart Rate (bpm): 147   Movement: Present     General:  Alert, oriented and cooperative. Patient is in no acute distress.  Skin: Skin is warm and dry. No rash noted.   Cardiovascular: Normal heart rate noted  Respiratory: Normal respiratory effort, no problems with respiration noted  Abdomen: Soft, gravid, appropriate for gestational age.  Pain/Pressure: Present     Pelvic: Cervical exam deferred        Extremities: Normal range of motion.  Edema: None  Mental Status:  Normal mood and affect. Normal behavior. Normal judgment and thought content.   Assessment and Plan:  Pregnancy: G3P1011 at 6832w6d  1. 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 Range 06/07/2017 14:46  TSH Latest Units: mIU/L 3.58    Preterm labor symptoms and general obstetric precautions including but not limited to vaginal bleeding, contractions, leaking of fluid and fetal movement were reviewed in detail with the patient. Please refer to After Visit Summary for other counseling recommendations.  RTO 4 weeks   Wynelle BourgeoisMarie  Kateena Degroote, CNM

## 2017-07-07 NOTE — Patient Instructions (Signed)

## 2017-07-12 ENCOUNTER — Other Ambulatory Visit (HOSPITAL_COMMUNITY): Payer: Self-pay | Admitting: *Deleted

## 2017-07-12 ENCOUNTER — Ambulatory Visit (HOSPITAL_COMMUNITY)
Admission: RE | Admit: 2017-07-12 | Discharge: 2017-07-12 | Disposition: A | Discharge status: Home / Self Care | Payer: 59 | Source: Ambulatory Visit | Attending: Advanced Practice Midwife | Admitting: Advanced Practice Midwife

## 2017-07-12 ENCOUNTER — Encounter (HOSPITAL_COMMUNITY): Payer: Self-pay

## 2017-07-12 DIAGNOSIS — M329 Systemic lupus erythematosus, unspecified: Secondary | ICD-10-CM | POA: Insufficient documentation

## 2017-07-12 DIAGNOSIS — O99282 Endocrine, nutritional and metabolic diseases complicating pregnancy, second trimester: Secondary | ICD-10-CM | POA: Diagnosis not present

## 2017-07-12 DIAGNOSIS — O358XX0 Maternal care for other (suspected) fetal abnormality and damage, not applicable or unspecified: Secondary | ICD-10-CM | POA: Diagnosis not present

## 2017-07-12 DIAGNOSIS — O9989 Other specified diseases and conditions complicating pregnancy, childbirth and the puerperium: Secondary | ICD-10-CM | POA: Insufficient documentation

## 2017-07-12 DIAGNOSIS — Z3A25 25 weeks gestation of pregnancy: Secondary | ICD-10-CM | POA: Insufficient documentation

## 2017-07-12 DIAGNOSIS — E039 Hypothyroidism, unspecified: Secondary | ICD-10-CM | POA: Diagnosis not present

## 2017-07-12 DIAGNOSIS — Z79899 Other long term (current) drug therapy: Secondary | ICD-10-CM | POA: Insufficient documentation

## 2017-07-12 DIAGNOSIS — O99891 Other specified diseases and conditions complicating pregnancy: Secondary | ICD-10-CM

## 2017-08-04 ENCOUNTER — Ambulatory Visit (INDEPENDENT_AMBULATORY_CARE_PROVIDER_SITE_OTHER): Payer: 59 | Admitting: Obstetrics & Gynecology

## 2017-08-04 VITALS — BP 124/61 | HR 92 | Wt 176.0 lb

## 2017-08-04 DIAGNOSIS — O99283 Endocrine, nutritional and metabolic diseases complicating pregnancy, third trimester: Secondary | ICD-10-CM

## 2017-08-04 DIAGNOSIS — E038 Other specified hypothyroidism: Secondary | ICD-10-CM

## 2017-08-04 DIAGNOSIS — E559 Vitamin D deficiency, unspecified: Secondary | ICD-10-CM

## 2017-08-04 DIAGNOSIS — M329 Systemic lupus erythematosus, unspecified: Secondary | ICD-10-CM

## 2017-08-04 DIAGNOSIS — E039 Hypothyroidism, unspecified: Secondary | ICD-10-CM

## 2017-08-04 DIAGNOSIS — O0991 Supervision of high risk pregnancy, unspecified, first trimester: Secondary | ICD-10-CM

## 2017-08-04 DIAGNOSIS — Z3480 Encounter for supervision of other normal pregnancy, unspecified trimester: Secondary | ICD-10-CM

## 2017-08-04 DIAGNOSIS — L932 Other local lupus erythematosus: Secondary | ICD-10-CM

## 2017-08-04 DIAGNOSIS — O0993 Supervision of high risk pregnancy, unspecified, third trimester: Secondary | ICD-10-CM

## 2017-08-04 DIAGNOSIS — O9989 Other specified diseases and conditions complicating pregnancy, childbirth and the puerperium: Secondary | ICD-10-CM

## 2017-08-05 LAB — HIV ANTIBODY (ROUTINE TESTING W REFLEX): HIV 1&2 Ab, 4th Generation: NONREACTIVE

## 2017-08-05 LAB — CBC
HEMATOCRIT: 33.3 % — AB (ref 35.0–45.0)
Hemoglobin: 11.7 g/dL (ref 11.7–15.5)
MCH: 32.2 pg (ref 27.0–33.0)
MCHC: 35.1 g/dL (ref 32.0–36.0)
MCV: 91.7 fL (ref 80.0–100.0)
MPV: 9.7 fL (ref 7.5–12.5)
Platelets: 210 10*3/uL (ref 140–400)
RBC: 3.63 10*6/uL — ABNORMAL LOW (ref 3.80–5.10)
RDW: 12.3 % (ref 11.0–15.0)
WBC: 7.1 10*3/uL (ref 3.8–10.8)

## 2017-08-05 LAB — PROTEIN / CREATININE RATIO, URINE
CREATININE, URINE: 150 mg/dL (ref 20–275)
Protein/Creat Ratio: 113 mg/g creat (ref 21–161)
TOTAL PROTEIN, URINE: 17 mg/dL (ref 5–24)

## 2017-08-05 LAB — RPR: RPR: NONREACTIVE

## 2017-08-05 LAB — VITAMIN D 25 HYDROXY (VIT D DEFICIENCY, FRACTURES): Vit D, 25-Hydroxy: 39 ng/mL (ref 30–100)

## 2017-08-05 LAB — 2HR GTT W 1 HR, CARPENTER, 75 G
Glucose, 1 Hr, Gest: 105 mg/dL (ref 65–179)
Glucose, 2 Hr, Gest: 63 mg/dL — ABNORMAL LOW (ref 65–152)
Glucose, Fasting, Gest: 72 mg/dL (ref 65–91)

## 2017-08-05 LAB — TSH: TSH: 4.34 mIU/L

## 2017-08-05 NOTE — Progress Notes (Signed)
   PRENATAL VISIT NOTE  Subjective:  Melissa Burch is a 31 y.o. G3P1011 at 5417w0d being seen today for ongoing prenatal care.  She is currently monitored for the following issues for this high-risk pregnancy and has Lupus; Hypothyroidism; Pure hypercholesterolemia; IBS (irritable bowel syndrome); and Supervision of high risk pregnancy, antepartum, first trimester on their problem list.  Patient reports fatigue.  Contractions: Not present. Vag. Bleeding: None.  Movement: Present. Denies leaking of fluid.   The following portions of the patient's history were reviewed and updated as appropriate: allergies, current medications, past family history, past medical history, past social history, past surgical history and problem list. Problem list updated.  Objective:   Vitals:   08/04/17 0813  BP: 124/61  Pulse: 92  Weight: 176 lb (79.8 kg)    Fetal Status: Fetal Heart Rate (bpm): 142   Movement: Present     General:  Alert, oriented and cooperative. Patient is in no acute distress.  Skin: Skin is warm and dry. No rash noted.   Cardiovascular: Normal heart rate noted  Respiratory: Normal respiratory effort, no problems with respiration noted  Abdomen: Soft, gravid, appropriate for gestational age.  Pain/Pressure: Absent     Pelvic: Cervical exam deferred  Extremities: Normal range of motion.  Edema: None  Mental Status:  Normal mood and affect. Normal behavior. Normal judgment and thought content.   Assessment and Plan:  Pregnancy: G3P1011 at 2917w0d  1. Supervision of other normal pregnancy, antepartum - 2Hr GTT w/ 1 Hr Carpenter 75 g - CBC - HIV antibody (with reflex) - RPR  2. Supervision of high risk pregnancy, antepartum, first trimester  3. Hypothyroid in pregnancy, antepartum, third trimester - TSH--Her rheumatologist told her NOT to increase her synthroid earlier this pregnancy even though her TSH was out of range for pregnancy.  Will draw again today.  4. Vitamin D  deficiency - VITAMIN D 25 Hydroxy (Vit-D Deficiency, Fractures)  5. Systemic lupus erythematosus (SLE) affecting pregnancy, antepartum (HCC) - Protein / creatinine ratio, urine (baseline was cancelled)   Preterm labor symptoms and general obstetric precautions including but not limited to vaginal bleeding, contractions, leaking of fluid and fetal movement were reviewed in detail with the patient. Please refer to After Visit Summary for other counseling recommendations.  Return in about 2 weeks (around 08/18/2017).   Elsie LincolnKelly Reign Bartnick, MD

## 2017-08-07 ENCOUNTER — Other Ambulatory Visit: Payer: Self-pay | Admitting: Physician Assistant

## 2017-08-07 DIAGNOSIS — E039 Hypothyroidism, unspecified: Secondary | ICD-10-CM

## 2017-08-09 ENCOUNTER — Encounter (HOSPITAL_COMMUNITY): Payer: Self-pay

## 2017-08-09 ENCOUNTER — Other Ambulatory Visit (HOSPITAL_COMMUNITY): Payer: Self-pay | Admitting: Maternal and Fetal Medicine

## 2017-08-09 ENCOUNTER — Ambulatory Visit (HOSPITAL_COMMUNITY)
Admission: RE | Admit: 2017-08-09 | Discharge: 2017-08-09 | Disposition: A | Discharge status: Home / Self Care | Payer: 59 | Source: Ambulatory Visit | Attending: Obstetrics & Gynecology | Admitting: Obstetrics & Gynecology

## 2017-08-09 DIAGNOSIS — Z79899 Other long term (current) drug therapy: Secondary | ICD-10-CM | POA: Insufficient documentation

## 2017-08-09 DIAGNOSIS — M329 Systemic lupus erythematosus, unspecified: Secondary | ICD-10-CM | POA: Diagnosis not present

## 2017-08-09 DIAGNOSIS — O358XX0 Maternal care for other (suspected) fetal abnormality and damage, not applicable or unspecified: Secondary | ICD-10-CM | POA: Diagnosis not present

## 2017-08-09 DIAGNOSIS — O321XX0 Maternal care for breech presentation, not applicable or unspecified: Secondary | ICD-10-CM | POA: Diagnosis not present

## 2017-08-09 DIAGNOSIS — O99891 Other specified diseases and conditions complicating pregnancy: Secondary | ICD-10-CM

## 2017-08-09 DIAGNOSIS — Z3A29 29 weeks gestation of pregnancy: Secondary | ICD-10-CM

## 2017-08-09 DIAGNOSIS — O99282 Endocrine, nutritional and metabolic diseases complicating pregnancy, second trimester: Secondary | ICD-10-CM

## 2017-08-09 DIAGNOSIS — E039 Hypothyroidism, unspecified: Secondary | ICD-10-CM | POA: Insufficient documentation

## 2017-08-09 DIAGNOSIS — O9989 Other specified diseases and conditions complicating pregnancy, childbirth and the puerperium: Secondary | ICD-10-CM | POA: Insufficient documentation

## 2017-08-09 DIAGNOSIS — O359XX0 Maternal care for (suspected) fetal abnormality and damage, unspecified, not applicable or unspecified: Secondary | ICD-10-CM

## 2017-08-09 DIAGNOSIS — O99283 Endocrine, nutritional and metabolic diseases complicating pregnancy, third trimester: Secondary | ICD-10-CM | POA: Diagnosis not present

## 2017-08-09 NOTE — Telephone Encounter (Signed)
Pt is currently pregnant and had labs recently drawn by Dr. Marice Potterove but needs a refill on Levothyroxine.

## 2017-08-18 ENCOUNTER — Ambulatory Visit (INDEPENDENT_AMBULATORY_CARE_PROVIDER_SITE_OTHER): Payer: 59 | Admitting: Obstetrics & Gynecology

## 2017-08-18 VITALS — BP 110/66 | HR 74 | Wt 177.0 lb

## 2017-08-18 DIAGNOSIS — E038 Other specified hypothyroidism: Secondary | ICD-10-CM

## 2017-08-18 DIAGNOSIS — O0991 Supervision of high risk pregnancy, unspecified, first trimester: Secondary | ICD-10-CM

## 2017-08-18 DIAGNOSIS — L932 Other local lupus erythematosus: Secondary | ICD-10-CM

## 2017-08-18 NOTE — Progress Notes (Signed)
   PRENATAL VISIT NOTE  Subjective:  Melissa Burch is a 31 y.o. G3P1011 at 818w6d being seen today for ongoing prenatal care.  She is currently monitored for the following issues for this low-risk pregnancy and has Lupus; Hypothyroidism; Pure hypercholesterolemia; IBS (irritable bowel syndrome); and Supervision of high risk pregnancy, antepartum, first trimester on their problem list.  Patient reports no complaints.  Contractions: Not present. Vag. Bleeding: None.  Movement: Present. Denies leaking of fluid.   The following portions of the patient's history were reviewed and updated as appropriate: allergies, current medications, past family history, past medical history, past social history, past surgical history and problem list. Problem list updated.  Objective:   Vitals:   08/18/17 1037  BP: 110/66  Pulse: 74  Weight: 177 lb (80.3 kg)    Fetal Status: Fetal Heart Rate (bpm): 140   Movement: Present     General:  Alert, oriented and cooperative. Patient is in no acute distress.  Skin: Skin is warm and dry. No rash noted.   Cardiovascular: Normal heart rate noted  Respiratory: Normal respiratory effort, no problems with respiration noted  Abdomen: Soft, gravid, appropriate for gestational age.  Pain/Pressure: Absent     Pelvic: Cervical exam deferred        Extremities: Normal range of motion.  Edema: None  Mental Status:  Normal mood and affect. Normal behavior. Normal judgment and thought content.   Assessment and Plan:  Pregnancy: G3P1011 at 2318w6d  1. Supervision of high risk pregnancy, antepartum, first trimester - doing well 2. Other local lupus erythematosus - doing well  3. Other specified hypothyroidism Normal TSH 9/18  Preterm labor symptoms and general obstetric precautions including but not limited to vaginal bleeding, contractions, leaking of fluid and fetal movement were reviewed in detail with the patient. Please refer to After Visit Summary for other  counseling recommendations.  No Follow-up on file.   Allie BossierMyra C Jamita Mckelvin, MD

## 2017-08-24 ENCOUNTER — Telehealth: Payer: Self-pay | Admitting: *Deleted

## 2017-08-24 MED ORDER — LEVOTHYROXINE SODIUM 150 MCG PO TABS: 150.0000 ug | ORAL_TABLET | Freq: Every day | ORAL | 3 refills | Status: DC

## 2017-08-24 NOTE — Telephone Encounter (Signed)
-----   Message from Lesly DukesKelly H Leggett, MD sent at 08/24/2017  2:17 PM EST ----- Increase to 150 mcg daily.  Rpt TSH in 4 weeks.  Have her call is she feels anxious or tachycardic.

## 2017-08-24 NOTE — Telephone Encounter (Signed)
Pt notified that her TSH is elevated and per VO Dr Penne LashLeggett we are bumping her Synthroid to 150 mcg.  [facility]  Will recheck TSH in 4 weeks.

## 2017-08-24 NOTE — Telephone Encounter (Signed)
-----   Message from Lesly DukesKelly H Leggett, MD sent at 08/24/2017  1:00 PM EST ----- TSH elevated from pregnancy.  Rheumatologist had recommended that she not increase her dose of synthroid earlier this pregnancy when elevated.  RN to call patient and have her give rheumatologist latest value and see if s/he wants to treat.

## 2017-09-03 ENCOUNTER — Ambulatory Visit (INDEPENDENT_AMBULATORY_CARE_PROVIDER_SITE_OTHER): Payer: 59 | Admitting: Obstetrics & Gynecology

## 2017-09-03 VITALS — BP 104/70 | HR 79 | Wt 179.0 lb

## 2017-09-03 DIAGNOSIS — O0993 Supervision of high risk pregnancy, unspecified, third trimester: Secondary | ICD-10-CM

## 2017-09-03 DIAGNOSIS — L930 Discoid lupus erythematosus: Secondary | ICD-10-CM

## 2017-09-03 DIAGNOSIS — E034 Atrophy of thyroid (acquired): Secondary | ICD-10-CM

## 2017-09-03 DIAGNOSIS — O0991 Supervision of high risk pregnancy, unspecified, first trimester: Secondary | ICD-10-CM

## 2017-09-03 NOTE — Progress Notes (Signed)
   PRENATAL VISIT NOTE  Subjective:  Melissa Burch is a 31 y.o. G3P1011 at 4264w1d being seen today for ongoing prenatal care.  She is currently monitored for the following issues for this high-risk pregnancy and has Lupus; Hypothyroidism; Pure hypercholesterolemia; IBS (irritable bowel syndrome); and Supervision of high risk pregnancy, antepartum, first trimester on their problem list.  Patient reports heartburn.  Contractions: Not present. Vag. Bleeding: None.  Movement: Present. Denies leaking of fluid.   The following portions of the patient's history were reviewed and updated as appropriate: allergies, current medications, past family history, past medical history, past social history, past surgical history and problem list. Problem list updated.  Objective:   Vitals:   09/03/17 0940  BP: 104/70  Pulse: 79  Weight: 179 lb (81.2 kg)    Fetal Status: Fetal Heart Rate (bpm): 141 Fundal Height: 32 cm Movement: Present  Presentation: Transverse  General:  Alert, oriented and cooperative. Patient is in no acute distress.  Skin: Skin is warm and dry. No rash noted.   Cardiovascular: Normal heart rate noted  Respiratory: Normal respiratory effort, no problems with respiration noted  Abdomen: Soft, gravid, appropriate for gestational age.  Pain/Pressure: Absent     Pelvic: Cervical exam deferred        Extremities: Normal range of motion.  Edema: None  Mental Status:  Normal mood and affect. Normal behavior. Normal judgment and thought content.   Assessment and Plan:  Pregnancy: G3P1011 at 4864w1d  1. Supervision of high risk pregnancy, antepartum, first trimester Has heart burn and taking tums. Decrease caffeine if possible.  Discussed OTC Zantac if she needs it.    2. Lupus erythematosus, unspecified form Twice weekly testing starting today.  BPP Monday at time of her US on Monday. NST reactive today with moderate variability.  3. Hypothyroidism due to acquired atrophy of  thyroid TSH at 36 weeks  Preterm labor symptoms and general obstetric precautions including but not limited to vaginal bleeding, contractions, leaking of fluid and fetal movement were reviewed in detail with the patient. Please refer to After Visit Summary for other counseling recommendations.  Return in about 2 weeks (around 09/17/2017).   Elsie LincolnKelly Ameera Tigue, MD

## 2017-09-06 ENCOUNTER — Other Ambulatory Visit (HOSPITAL_COMMUNITY): Payer: Self-pay | Admitting: Maternal and Fetal Medicine

## 2017-09-06 ENCOUNTER — Encounter (HOSPITAL_COMMUNITY): Payer: Self-pay

## 2017-09-06 ENCOUNTER — Other Ambulatory Visit: Payer: Self-pay | Admitting: Obstetrics & Gynecology

## 2017-09-06 ENCOUNTER — Other Ambulatory Visit (HOSPITAL_COMMUNITY): Payer: Self-pay | Admitting: *Deleted

## 2017-09-06 ENCOUNTER — Ambulatory Visit (HOSPITAL_COMMUNITY)
Admission: RE | Admit: 2017-09-06 | Discharge: 2017-09-06 | Disposition: A | Discharge status: Home / Self Care | Payer: 59 | Source: Ambulatory Visit | Attending: Obstetrics & Gynecology | Admitting: Obstetrics & Gynecology

## 2017-09-06 DIAGNOSIS — O99283 Endocrine, nutritional and metabolic diseases complicating pregnancy, third trimester: Secondary | ICD-10-CM

## 2017-09-06 DIAGNOSIS — M329 Systemic lupus erythematosus, unspecified: Secondary | ICD-10-CM | POA: Insufficient documentation

## 2017-09-06 DIAGNOSIS — L930 Discoid lupus erythematosus: Secondary | ICD-10-CM

## 2017-09-06 DIAGNOSIS — Z3A33 33 weeks gestation of pregnancy: Secondary | ICD-10-CM

## 2017-09-06 DIAGNOSIS — E034 Atrophy of thyroid (acquired): Secondary | ICD-10-CM

## 2017-09-06 DIAGNOSIS — O26893 Other specified pregnancy related conditions, third trimester: Secondary | ICD-10-CM | POA: Insufficient documentation

## 2017-09-06 DIAGNOSIS — D6862 Lupus anticoagulant syndrome: Secondary | ICD-10-CM

## 2017-09-06 DIAGNOSIS — O9989 Other specified diseases and conditions complicating pregnancy, childbirth and the puerperium: Secondary | ICD-10-CM

## 2017-09-06 DIAGNOSIS — E039 Hypothyroidism, unspecified: Secondary | ICD-10-CM

## 2017-09-06 DIAGNOSIS — O0993 Supervision of high risk pregnancy, unspecified, third trimester: Secondary | ICD-10-CM | POA: Insufficient documentation

## 2017-09-06 DIAGNOSIS — O0991 Supervision of high risk pregnancy, unspecified, first trimester: Secondary | ICD-10-CM

## 2017-09-06 DIAGNOSIS — O99113 Other diseases of the blood and blood-forming organs and certain disorders involving the immune mechanism complicating pregnancy, third trimester: Principal | ICD-10-CM

## 2017-09-09 ENCOUNTER — Ambulatory Visit (INDEPENDENT_AMBULATORY_CARE_PROVIDER_SITE_OTHER): Payer: 59 | Admitting: *Deleted

## 2017-09-09 VITALS — BP 108/64 | HR 75 | Wt 177.0 lb

## 2017-09-09 DIAGNOSIS — E039 Hypothyroidism, unspecified: Secondary | ICD-10-CM

## 2017-09-09 DIAGNOSIS — O99283 Endocrine, nutritional and metabolic diseases complicating pregnancy, third trimester: Secondary | ICD-10-CM

## 2017-09-15 ENCOUNTER — Ambulatory Visit (INDEPENDENT_AMBULATORY_CARE_PROVIDER_SITE_OTHER): Payer: 59 | Admitting: Obstetrics & Gynecology

## 2017-09-15 VITALS — BP 105/68 | HR 72 | Wt 180.0 lb

## 2017-09-15 DIAGNOSIS — O0991 Supervision of high risk pregnancy, unspecified, first trimester: Secondary | ICD-10-CM

## 2017-09-15 DIAGNOSIS — L930 Discoid lupus erythematosus: Secondary | ICD-10-CM

## 2017-09-15 DIAGNOSIS — O99283 Endocrine, nutritional and metabolic diseases complicating pregnancy, third trimester: Secondary | ICD-10-CM

## 2017-09-15 DIAGNOSIS — E039 Hypothyroidism, unspecified: Secondary | ICD-10-CM

## 2017-09-15 DIAGNOSIS — O0993 Supervision of high risk pregnancy, unspecified, third trimester: Secondary | ICD-10-CM

## 2017-09-15 NOTE — Progress Notes (Signed)
   PRENATAL VISIT NOTE  Subjective:  Melissa Burch is a 31 y.o. G3P1011 at 6517w6d being seen today for ongoing prenatal care.  She is currently monitored for the following issues for this high-risk pregnancy and has Lupus; Hypothyroidism; Pure hypercholesterolemia; IBS (irritable bowel syndrome); and Supervision of high risk pregnancy, antepartum, first trimester on their problem list.  Patient reports no complaints.  Contractions: Irritability. Vag. Bleeding: None.  Movement: Present. Denies leaking of fluid.   The following portions of the patient's history were reviewed and updated as appropriate: allergies, current medications, past family history, past medical history, past social history, past surgical history and problem list. Problem list updated.  Objective:   Vitals:   09/15/17 1039  BP: 105/68  Pulse: 72  Weight: 180 lb (81.6 kg)    Fetal Status:     Movement: Present     General:  Alert, oriented and cooperative. Patient is in no acute distress.  Skin: Skin is warm and dry. No rash noted.   Cardiovascular: Normal heart rate noted  Respiratory: Normal respiratory effort, no problems with respiration noted  Abdomen: Soft, gravid, appropriate for gestational age.  Pain/Pressure: Present     Pelvic: Cervical exam deferred        Extremities: Normal range of motion.  Edema: None  Mental Status:  Normal mood and affect. Normal behavior. Normal judgment and thought content.   Assessment and Plan:  Pregnancy: G3P1011 at 3817w6d  1. Supervision of high risk pregnancy, antepartum, first trimester   2. Lupus erythematosus, unspecified form - continue testing - IOL at 39 weeks  Preterm labor symptoms and general obstetric precautions including but not limited to vaginal bleeding, contractions, leaking of fluid and fetal movement were reviewed in detail with the patient. Please refer to After Visit Summary for other counseling recommendations.  No Follow-up on file.   Allie BossierMyra  C Dannisha Eckmann, MD

## 2017-09-21 NOTE — L&D Delivery Note (Signed)
Delivery Note At 6:20 AM a viable female was delivered via Vaginal, Spontaneous (Presentation: vertex; ROA) delivery over intact perineum (c/b 2nd deg lac).  APGAR: 8, 9; weight pending.   Placenta status: intact, normal.  Cord: 3 vessel without complications.  Cord pH: not sent Head delivered ROA. No nuchal cord present loose x1. Shoulder and body delivered in usual fashion. Infant placed on mother's abdomen, dried and bulb suctioned. Cord clamped x 2 after 1-minute delay, and cut by family member. Cord blood drawn. After 1 minute, the cord was clamped and cut. 40 units of pitocin diluted in 1000cc LR was infused rapidly IV.  The placenta separated spontaneously and delivered via CCT and maternal pushing effort.  It was inspected and appears to be intact with a 3 VC.   Anesthesia: none   Episiotomy: None Lacerations: 2nd degree;Perineal Suture Repair: 3.0 vicryl Est. Blood Loss (mL):  350  Mom to postpartum.  Baby to Couplet care / Skin to Skin.  Melissa BailiffParker W Mirela Burch 10/09/2017, 7:02 AM

## 2017-09-22 ENCOUNTER — Ambulatory Visit (HOSPITAL_COMMUNITY)
Admission: RE | Admit: 2017-09-22 | Discharge: 2017-09-22 | Disposition: A | Discharge status: Home / Self Care | Payer: 59 | Source: Ambulatory Visit | Attending: Obstetrics & Gynecology | Admitting: Obstetrics & Gynecology

## 2017-09-22 ENCOUNTER — Other Ambulatory Visit (HOSPITAL_COMMUNITY): Payer: Self-pay | Admitting: Obstetrics and Gynecology

## 2017-09-22 ENCOUNTER — Encounter (HOSPITAL_COMMUNITY): Payer: Self-pay

## 2017-09-22 DIAGNOSIS — O99280 Endocrine, nutritional and metabolic diseases complicating pregnancy, unspecified trimester: Secondary | ICD-10-CM | POA: Insufficient documentation

## 2017-09-22 DIAGNOSIS — E079 Disorder of thyroid, unspecified: Secondary | ICD-10-CM

## 2017-09-22 DIAGNOSIS — O99113 Other diseases of the blood and blood-forming organs and certain disorders involving the immune mechanism complicating pregnancy, third trimester: Secondary | ICD-10-CM

## 2017-09-22 DIAGNOSIS — D6862 Lupus anticoagulant syndrome: Secondary | ICD-10-CM

## 2017-09-22 DIAGNOSIS — Z3A35 35 weeks gestation of pregnancy: Secondary | ICD-10-CM | POA: Insufficient documentation

## 2017-09-23 ENCOUNTER — Encounter: Payer: Self-pay | Admitting: Obstetrics & Gynecology

## 2017-09-23 ENCOUNTER — Ambulatory Visit (INDEPENDENT_AMBULATORY_CARE_PROVIDER_SITE_OTHER): Payer: 59 | Admitting: Obstetrics & Gynecology

## 2017-09-23 VITALS — BP 113/67 | HR 76 | Wt 181.0 lb

## 2017-09-23 DIAGNOSIS — Z113 Encounter for screening for infections with a predominantly sexual mode of transmission: Secondary | ICD-10-CM

## 2017-09-23 DIAGNOSIS — Z3480 Encounter for supervision of other normal pregnancy, unspecified trimester: Secondary | ICD-10-CM

## 2017-09-23 NOTE — Progress Notes (Signed)
   PRENATAL VISIT NOTE  Subjective:  Melissa Burch is a 32 y.o. G3P1011 at 80103w0d being seen today for ongoing prenatal care.  She is currently monitored for the following issues for this high-risk pregnancy and has Lupus; Hypothyroidism; Pure hypercholesterolemia; IBS (irritable bowel syndrome); and Supervision of high risk pregnancy, antepartum, first trimester on their problem list.  Patient reports no complaints.  Contractions: Irritability. Vag. Bleeding: None.  Movement: Present. Denies leaking of fluid.   The following portions of the patient's history were reviewed and updated as appropriate: allergies, current medications, past family history, past medical history, past social history, past surgical history and problem list. Problem list updated.  Objective:   Vitals:   09/23/17 1500  BP: 113/67  Pulse: 76  Weight: 181 lb (82.1 kg)    Fetal Status: Fetal Heart Rate (bpm): 137 Fundal Height: 37 cm Movement: Present  Presentation: Vertex  General:  Alert, oriented and cooperative. Patient is in no acute distress.  Skin: Skin is warm and dry. No rash noted.   Cardiovascular: Normal heart rate noted  Respiratory: Normal respiratory effort, no problems with respiration noted  Abdomen: Soft, gravid, appropriate for gestational age.  Pain/Pressure: Present     Pelvic: Cervical exam performed Dilation: 1 Effacement (%): 50 Station: Ballotable  Extremities: Normal range of motion.  Edema: None  Mental Status:  Normal mood and affect. Normal behavior. Normal judgment and thought content.   Assessment and Plan:  Pregnancy: G3P1011 at 13103w0d  1. Supervision of other normal pregnancy, antepartum - Urine cytology ancillary only - Culture, beta strep (group b only) - TSH  2.  RA Continue weekly BPP at MFM Induction at 39 weeks.  Term labor symptoms and general obstetric precautions including but not limited to vaginal bleeding, contractions, leaking of fluid and fetal movement  were reviewed in detail with the patient. Please refer to After Visit Summary for other counseling recommendations.  Return in about 1 week (around 09/30/2017).   Elsie LincolnKelly Tareka Jhaveri, MD

## 2017-09-24 LAB — TSH: TSH: 1.89 mIU/L

## 2017-09-25 LAB — CULTURE, BETA STREP (GROUP B ONLY)
MICRO NUMBER: 90010446
SPECIMEN QUALITY:: ADEQUATE

## 2017-09-27 ENCOUNTER — Telehealth: Payer: Self-pay | Admitting: *Deleted

## 2017-09-27 LAB — URINE CYTOLOGY ANCILLARY ONLY
Chlamydia: NEGATIVE
Neisseria Gonorrhea: NEGATIVE

## 2017-09-27 NOTE — Telephone Encounter (Signed)
-----   Message from Lesly DukesKelly H Leggett, MD sent at 09/24/2017  3:52 PM EST ----- TSH is now normal, continue synthroid at current dose.

## 2017-09-27 NOTE — Telephone Encounter (Signed)
Pt notified of normal TSH and will continue with the same dose of Synthroid per Dr Penne LashLeggett.

## 2017-09-28 ENCOUNTER — Ambulatory Visit (INDEPENDENT_AMBULATORY_CARE_PROVIDER_SITE_OTHER): Payer: 59 | Admitting: Obstetrics & Gynecology

## 2017-09-28 VITALS — BP 102/58 | HR 81 | Wt 181.0 lb

## 2017-09-28 DIAGNOSIS — L930 Discoid lupus erythematosus: Secondary | ICD-10-CM

## 2017-09-28 DIAGNOSIS — O0993 Supervision of high risk pregnancy, unspecified, third trimester: Secondary | ICD-10-CM | POA: Diagnosis not present

## 2017-09-28 DIAGNOSIS — O0991 Supervision of high risk pregnancy, unspecified, first trimester: Secondary | ICD-10-CM

## 2017-09-28 DIAGNOSIS — D6862 Lupus anticoagulant syndrome: Secondary | ICD-10-CM

## 2017-09-28 DIAGNOSIS — O99113 Other diseases of the blood and blood-forming organs and certain disorders involving the immune mechanism complicating pregnancy, third trimester: Secondary | ICD-10-CM

## 2017-09-28 NOTE — Progress Notes (Signed)
   PRENATAL VISIT NOTE  Subjective:  Melissa Burch is a 32 y.o. G3P1011 at 4437w5d being seen today for ongoing prenatal care.  She is currently monitored for the following issues for this high-risk pregnancy and has Lupus; Hypothyroidism; Pure hypercholesterolemia; IBS (irritable bowel syndrome); and Supervision of high risk pregnancy, antepartum, first trimester on their problem list.  Patient reports no complaints.  Contractions: Irritability. Vag. Bleeding: Scant.  Movement: Present. Denies leaking of fluid.   The following portions of the patient's history were reviewed and updated as appropriate: allergies, current medications, past family history, past medical history, past social history, past surgical history and problem list. Problem list updated.  Objective:   Vitals:   09/28/17 1410  BP: (!) 102/58  Pulse: 81  Weight: 181 lb (82.1 kg)    Fetal Status: Fetal Heart Rate (bpm): 136   Movement: Present     General:  Alert, oriented and cooperative. Patient is in no acute distress.  Skin: Skin is warm and dry. No rash noted.   Cardiovascular: Normal heart rate noted  Respiratory: Normal respiratory effort, no problems with respiration noted  Abdomen: Soft, gravid, appropriate for gestational age.  Pain/Pressure: Present     Pelvic: Cervical exam deferred        Extremities: Normal range of motion.  Edema: None  Mental Status:  Normal mood and affect. Normal behavior. Normal judgment and thought content.   Assessment and Plan:  Pregnancy: G3P1011 at 4137w5d  1. Supervision of high risk pregnancy, antepartum, first trimester   2. Lupus erythematosus, unspecified form - IOL at 39 weeks She has an u/s for growth and BPP at [facility] in 6 days  3. + GBS- she will need treatment in labor. We discussed this today.   Preterm labor symptoms and general obstetric precautions including but not limited to vaginal bleeding, contractions, leaking of fluid and fetal movement were reviewed  in detail with the patient. Please refer to After Visit Summary for other counseling recommendations.  Return in about 1 week (around 10/05/2017).   Allie BossierMyra C Emmanuel Gruenhagen, MD

## 2017-09-29 ENCOUNTER — Encounter: Payer: 59 | Admitting: Obstetrics and Gynecology

## 2017-10-01 ENCOUNTER — Encounter: Payer: 59 | Admitting: Certified Nurse Midwife

## 2017-10-01 ENCOUNTER — Ambulatory Visit (INDEPENDENT_AMBULATORY_CARE_PROVIDER_SITE_OTHER): Payer: 59 | Admitting: *Deleted

## 2017-10-01 VITALS — BP 113/68 | HR 68 | Wt 183.0 lb

## 2017-10-01 DIAGNOSIS — O9989 Other specified diseases and conditions complicating pregnancy, childbirth and the puerperium: Secondary | ICD-10-CM

## 2017-10-01 DIAGNOSIS — O0991 Supervision of high risk pregnancy, unspecified, first trimester: Secondary | ICD-10-CM

## 2017-10-04 ENCOUNTER — Other Ambulatory Visit (HOSPITAL_COMMUNITY): Payer: Self-pay | Admitting: Obstetrics and Gynecology

## 2017-10-04 ENCOUNTER — Encounter (HOSPITAL_COMMUNITY): Payer: Self-pay

## 2017-10-04 ENCOUNTER — Ambulatory Visit (HOSPITAL_COMMUNITY)
Admission: RE | Admit: 2017-10-04 | Discharge: 2017-10-04 | Disposition: A | Discharge status: Home / Self Care | Payer: 59 | Source: Ambulatory Visit | Attending: Obstetrics & Gynecology | Admitting: Obstetrics & Gynecology

## 2017-10-04 DIAGNOSIS — Z3A37 37 weeks gestation of pregnancy: Secondary | ICD-10-CM

## 2017-10-04 DIAGNOSIS — O99283 Endocrine, nutritional and metabolic diseases complicating pregnancy, third trimester: Secondary | ICD-10-CM | POA: Insufficient documentation

## 2017-10-04 DIAGNOSIS — O99113 Other diseases of the blood and blood-forming organs and certain disorders involving the immune mechanism complicating pregnancy, third trimester: Principal | ICD-10-CM

## 2017-10-04 DIAGNOSIS — E038 Other specified hypothyroidism: Secondary | ICD-10-CM

## 2017-10-04 DIAGNOSIS — D6862 Lupus anticoagulant syndrome: Secondary | ICD-10-CM

## 2017-10-04 DIAGNOSIS — E039 Hypothyroidism, unspecified: Secondary | ICD-10-CM | POA: Insufficient documentation

## 2017-10-04 DIAGNOSIS — O26893 Other specified pregnancy related conditions, third trimester: Secondary | ICD-10-CM | POA: Diagnosis not present

## 2017-10-04 DIAGNOSIS — O9989 Other specified diseases and conditions complicating pregnancy, childbirth and the puerperium: Secondary | ICD-10-CM | POA: Diagnosis not present

## 2017-10-04 DIAGNOSIS — M329 Systemic lupus erythematosus, unspecified: Secondary | ICD-10-CM | POA: Insufficient documentation

## 2017-10-07 ENCOUNTER — Encounter: Payer: Self-pay | Admitting: Obstetrics and Gynecology

## 2017-10-07 ENCOUNTER — Ambulatory Visit (INDEPENDENT_AMBULATORY_CARE_PROVIDER_SITE_OTHER): Payer: 59 | Admitting: Obstetrics and Gynecology

## 2017-10-07 VITALS — BP 113/62 | HR 65 | Wt 181.0 lb

## 2017-10-07 DIAGNOSIS — O0991 Supervision of high risk pregnancy, unspecified, first trimester: Secondary | ICD-10-CM

## 2017-10-07 DIAGNOSIS — L930 Discoid lupus erythematosus: Secondary | ICD-10-CM

## 2017-10-07 DIAGNOSIS — E034 Atrophy of thyroid (acquired): Secondary | ICD-10-CM

## 2017-10-07 DIAGNOSIS — O99820 Streptococcus B carrier state complicating pregnancy: Secondary | ICD-10-CM | POA: Insufficient documentation

## 2017-10-07 NOTE — Progress Notes (Signed)
   PRENATAL VISIT NOTE  Subjective:  Melissa Burch is a 32 y.o. G3P1011 at 3681w0d being seen today for ongoing prenatal care.  She is currently monitored for the following issues for this high-risk pregnancy and has Lupus; Hypothyroidism; Pure hypercholesterolemia; IBS (irritable bowel syndrome); Supervision of high risk pregnancy, antepartum, first trimester; and GBS (group B Streptococcus carrier), +RV culture, currently pregnant on their problem list.  Patient reports no complaints.  Contractions: Irritability. Vag. Bleeding: None.  Movement: Present. Denies leaking of fluid.   The following portions of the patient's history were reviewed and updated as appropriate: allergies, current medications, past family history, past medical history, past social history, past surgical history and problem list. Problem list updated.  Objective:   Vitals:   10/07/17 1457  BP: 113/62  Pulse: 65  Weight: 181 lb (82.1 kg)    Fetal Status: Fetal Heart Rate (bpm): 142 Fundal Height: 37 cm Movement: Present  Presentation: Vertex  General:  Alert, oriented and cooperative. Patient is in no acute distress.  Skin: Skin is warm and dry. No rash noted.   Cardiovascular: Normal heart rate noted  Respiratory: Normal respiratory effort, no problems with respiration noted  Abdomen: Soft, gravid, appropriate for gestational age.  Pain/Pressure: Present     Pelvic: Cervical exam performed Dilation: 2 Effacement (%): 50 Station: Ballotable  Extremities: Normal range of motion.  Edema: None  Mental Status:  Normal mood and affect. Normal behavior. Normal judgment and thought content.   Assessment and Plan:  Pregnancy: G3P1011 at 8481w0d  1. Supervision of high risk pregnancy, antepartum, first trimester Patient is doing well without complaints Membranes stripped  2. Hypothyroidism due to acquired atrophy of thyroid   3. Lupus erythematosus, unspecified form Normal BPP earlier this weel Plan for IOL at 39  weeks Antenatal testing early next week  4. GBS (group B Streptococcus carrier), +RV culture, currently pregnant Abx in labor  Term labor symptoms and general obstetric precautions including but not limited to vaginal bleeding, contractions, leaking of fluid and fetal movement were reviewed in detail with the patient. Please refer to After Visit Summary for other counseling recommendations.  Return in about 1 week (around 10/14/2017) for ROB.   Catalina AntiguaPeggy Shanaya Schneck, MD

## 2017-10-08 ENCOUNTER — Telehealth (HOSPITAL_COMMUNITY): Payer: Self-pay | Admitting: *Deleted

## 2017-10-08 ENCOUNTER — Encounter (HOSPITAL_COMMUNITY): Payer: Self-pay | Admitting: *Deleted

## 2017-10-08 ENCOUNTER — Other Ambulatory Visit: Payer: Self-pay | Admitting: Advanced Practice Midwife

## 2017-10-08 NOTE — Telephone Encounter (Signed)
Preadmission screen  

## 2017-10-09 ENCOUNTER — Encounter (HOSPITAL_COMMUNITY): Payer: Self-pay

## 2017-10-09 ENCOUNTER — Inpatient Hospital Stay (HOSPITAL_COMMUNITY)
Admission: AD | Admit: 2017-10-09 | Discharge: 2017-10-09 | Disposition: A | Discharge status: Home / Self Care | Payer: 59 | Source: Ambulatory Visit | Attending: Obstetrics and Gynecology | Admitting: Obstetrics and Gynecology

## 2017-10-09 ENCOUNTER — Encounter (HOSPITAL_COMMUNITY): Payer: Self-pay | Admitting: *Deleted

## 2017-10-09 ENCOUNTER — Other Ambulatory Visit: Payer: Self-pay

## 2017-10-09 ENCOUNTER — Inpatient Hospital Stay (HOSPITAL_COMMUNITY)
Admission: AD | Admit: 2017-10-09 | Discharge: 2017-10-11 | DRG: 806 | Disposition: A | Discharge status: Home / Self Care | Payer: 59 | Source: Ambulatory Visit | Attending: Obstetrics and Gynecology | Admitting: Obstetrics and Gynecology

## 2017-10-09 DIAGNOSIS — Z7982 Long term (current) use of aspirin: Secondary | ICD-10-CM

## 2017-10-09 DIAGNOSIS — D6862 Lupus anticoagulant syndrome: Secondary | ICD-10-CM | POA: Diagnosis present

## 2017-10-09 DIAGNOSIS — Z3483 Encounter for supervision of other normal pregnancy, third trimester: Secondary | ICD-10-CM | POA: Diagnosis present

## 2017-10-09 DIAGNOSIS — Z3A38 38 weeks gestation of pregnancy: Secondary | ICD-10-CM | POA: Diagnosis not present

## 2017-10-09 DIAGNOSIS — O99284 Endocrine, nutritional and metabolic diseases complicating childbirth: Secondary | ICD-10-CM | POA: Diagnosis present

## 2017-10-09 DIAGNOSIS — O9912 Other diseases of the blood and blood-forming organs and certain disorders involving the immune mechanism complicating childbirth: Secondary | ICD-10-CM | POA: Diagnosis present

## 2017-10-09 DIAGNOSIS — O471 False labor at or after 37 completed weeks of gestation: Secondary | ICD-10-CM

## 2017-10-09 DIAGNOSIS — O99824 Streptococcus B carrier state complicating childbirth: Secondary | ICD-10-CM | POA: Diagnosis present

## 2017-10-09 DIAGNOSIS — M329 Systemic lupus erythematosus, unspecified: Secondary | ICD-10-CM | POA: Diagnosis present

## 2017-10-09 DIAGNOSIS — O99820 Streptococcus B carrier state complicating pregnancy: Secondary | ICD-10-CM

## 2017-10-09 DIAGNOSIS — E039 Hypothyroidism, unspecified: Secondary | ICD-10-CM | POA: Diagnosis present

## 2017-10-09 DIAGNOSIS — O0991 Supervision of high risk pregnancy, unspecified, first trimester: Secondary | ICD-10-CM

## 2017-10-09 HISTORY — DX: Hypothyroidism, unspecified: E03.9

## 2017-10-09 LAB — TYPE AND SCREEN
ABO/RH(D): O POS
ANTIBODY SCREEN: NEGATIVE

## 2017-10-09 LAB — CBC
HCT: 35.9 % — ABNORMAL LOW (ref 36.0–46.0)
Hemoglobin: 12.9 g/dL (ref 12.0–15.0)
MCH: 32.3 pg (ref 26.0–34.0)
MCHC: 35.9 g/dL (ref 30.0–36.0)
MCV: 89.8 fL (ref 78.0–100.0)
Platelets: 218 10*3/uL (ref 150–400)
RBC: 4 MIL/uL (ref 3.87–5.11)
RDW: 12.2 % (ref 11.5–15.5)
WBC: 9.5 10*3/uL (ref 4.0–10.5)

## 2017-10-09 LAB — RPR: RPR: NONREACTIVE

## 2017-10-09 LAB — POCT FERN TEST: POCT FERN TEST: POSITIVE — AB

## 2017-10-09 MED ORDER — ACETAMINOPHEN 325 MG PO TABS: 650.0000 mg | ORAL_TABLET | ORAL | Status: DC | PRN

## 2017-10-09 MED ORDER — TETANUS-DIPHTH-ACELL PERTUSSIS 5-2.5-18.5 LF-MCG/0.5 IM SUSP: 0.5000 mL | Freq: Once | INTRAMUSCULAR | Status: DC

## 2017-10-09 MED ORDER — DIBUCAINE 1 % RE OINT: 1.0000 "application " | TOPICAL_OINTMENT | RECTAL | Status: DC | PRN

## 2017-10-09 MED ORDER — LACTATED RINGERS IV SOLN: INTRAVENOUS | Status: DC

## 2017-10-09 MED ORDER — OXYTOCIN 40 UNITS IN LACTATED RINGERS INFUSION - SIMPLE MED
INTRAVENOUS | Status: AC
  Filled 2017-10-09: qty 1000

## 2017-10-09 MED ORDER — OXYCODONE-ACETAMINOPHEN 5-325 MG PO TABS: 2.0000 | ORAL_TABLET | ORAL | Status: DC | PRN

## 2017-10-09 MED ORDER — LEVOTHYROXINE SODIUM 150 MCG PO TABS
150.0000 ug | ORAL_TABLET | Freq: Every day | ORAL | Status: DC
  Administered 2017-10-10 – 2017-10-11 (×2): 150 ug via ORAL
  Filled 2017-10-09 (×2): qty 1

## 2017-10-09 MED ORDER — LIDOCAINE HCL (PF) 1 % IJ SOLN
INTRAMUSCULAR | Status: AC
  Administered 2017-10-09: 30 mL
  Filled 2017-10-09: qty 30

## 2017-10-09 MED ORDER — PENICILLIN G POTASSIUM 5000000 UNITS IJ SOLR
5.0000 10*6.[IU] | Freq: Once | INTRAVENOUS | Status: DC
  Filled 2017-10-09: qty 5

## 2017-10-09 MED ORDER — FENTANYL 2.5 MCG/ML BUPIVACAINE 1/10 % EPIDURAL INFUSION (WH - ANES): 14.0000 mL/h | INTRAMUSCULAR | Status: DC | PRN

## 2017-10-09 MED ORDER — FLEET ENEMA 7-19 GM/118ML RE ENEM: 1.0000 | ENEMA | RECTAL | Status: DC | PRN

## 2017-10-09 MED ORDER — OXYTOCIN BOLUS FROM INFUSION
500.0000 mL | Freq: Once | INTRAVENOUS | Status: AC
  Administered 2017-10-09: 500 mL via INTRAVENOUS

## 2017-10-09 MED ORDER — ONDANSETRON HCL 4 MG/2ML IJ SOLN: 4.0000 mg | Freq: Four times a day (QID) | INTRAMUSCULAR | Status: DC | PRN

## 2017-10-09 MED ORDER — EPHEDRINE 5 MG/ML INJ
10.0000 mg | INTRAVENOUS | Status: DC | PRN
  Filled 2017-10-09: qty 2

## 2017-10-09 MED ORDER — OXYTOCIN 40 UNITS IN LACTATED RINGERS INFUSION - SIMPLE MED
2.5000 [IU]/h | INTRAVENOUS | Status: DC
  Administered 2017-10-09: 2.5 [IU]/h via INTRAVENOUS

## 2017-10-09 MED ORDER — HYDROXYCHLOROQUINE SULFATE 200 MG PO TABS
200.0000 mg | ORAL_TABLET | Freq: Two times a day (BID) | ORAL | Status: DC
  Administered 2017-10-09 – 2017-10-11 (×5): 200 mg via ORAL
  Filled 2017-10-09 (×5): qty 1

## 2017-10-09 MED ORDER — IBUPROFEN 600 MG PO TABS
600.0000 mg | ORAL_TABLET | Freq: Four times a day (QID) | ORAL | Status: DC
  Administered 2017-10-09 – 2017-10-11 (×10): 600 mg via ORAL
  Filled 2017-10-09 (×10): qty 1

## 2017-10-09 MED ORDER — OXYTOCIN 10 UNIT/ML IJ SOLN
INTRAMUSCULAR | Status: AC
  Filled 2017-10-09: qty 2

## 2017-10-09 MED ORDER — WITCH HAZEL-GLYCERIN EX PADS: 1.0000 "application " | MEDICATED_PAD | CUTANEOUS | Status: DC | PRN

## 2017-10-09 MED ORDER — BENZOCAINE-MENTHOL 20-0.5 % EX AERO
1.0000 "application " | INHALATION_SPRAY | CUTANEOUS | Status: DC | PRN
  Administered 2017-10-09 – 2017-10-11 (×2): 1 via TOPICAL
  Filled 2017-10-09 (×2): qty 56

## 2017-10-09 MED ORDER — SENNOSIDES-DOCUSATE SODIUM 8.6-50 MG PO TABS
2.0000 | ORAL_TABLET | ORAL | Status: DC
  Administered 2017-10-09 – 2017-10-10 (×2): 2 via ORAL
  Filled 2017-10-09 (×2): qty 2

## 2017-10-09 MED ORDER — DIPHENHYDRAMINE HCL 25 MG PO CAPS: 25.0000 mg | ORAL_CAPSULE | Freq: Four times a day (QID) | ORAL | Status: DC | PRN

## 2017-10-09 MED ORDER — PRENATAL MULTIVITAMIN CH
1.0000 | ORAL_TABLET | Freq: Every day | ORAL | Status: DC
  Administered 2017-10-09 – 2017-10-11 (×3): 1 via ORAL
  Filled 2017-10-09 (×3): qty 1

## 2017-10-09 MED ORDER — PHENYLEPHRINE 40 MCG/ML (10ML) SYRINGE FOR IV PUSH (FOR BLOOD PRESSURE SUPPORT)
80.0000 ug | PREFILLED_SYRINGE | INTRAVENOUS | Status: DC | PRN
  Filled 2017-10-09: qty 5

## 2017-10-09 MED ORDER — ONDANSETRON HCL 4 MG/2ML IJ SOLN: 4.0000 mg | INTRAMUSCULAR | Status: DC | PRN

## 2017-10-09 MED ORDER — SIMETHICONE 80 MG PO CHEW
80.0000 mg | CHEWABLE_TABLET | ORAL | Status: DC | PRN
Start: 2017-10-09 — End: 2017-10-11

## 2017-10-09 MED ORDER — LACTATED RINGERS IV SOLN: 500.0000 mL | Freq: Once | INTRAVENOUS | Status: DC

## 2017-10-09 MED ORDER — LACTATED RINGERS IV SOLN: 500.0000 mL | INTRAVENOUS | Status: DC | PRN

## 2017-10-09 MED ORDER — OXYCODONE-ACETAMINOPHEN 5-325 MG PO TABS: 1.0000 | ORAL_TABLET | ORAL | Status: DC | PRN

## 2017-10-09 MED ORDER — SOD CITRATE-CITRIC ACID 500-334 MG/5ML PO SOLN: 30.0000 mL | ORAL | Status: DC | PRN

## 2017-10-09 MED ORDER — DIPHENHYDRAMINE HCL 50 MG/ML IJ SOLN: 12.5000 mg | INTRAMUSCULAR | Status: DC | PRN

## 2017-10-09 MED ORDER — COCONUT OIL OIL: 1.0000 "application " | TOPICAL_OIL | Status: DC | PRN

## 2017-10-09 MED ORDER — ZOLPIDEM TARTRATE 5 MG PO TABS: 5.0000 mg | ORAL_TABLET | Freq: Every evening | ORAL | Status: DC | PRN

## 2017-10-09 MED ORDER — PENICILLIN G POT IN DEXTROSE 60000 UNIT/ML IV SOLN
3.0000 10*6.[IU] | INTRAVENOUS | Status: DC
  Filled 2017-10-09 (×2): qty 50

## 2017-10-09 MED ORDER — ONDANSETRON HCL 4 MG PO TABS: 4.0000 mg | ORAL_TABLET | ORAL | Status: DC | PRN

## 2017-10-09 MED ORDER — LIDOCAINE HCL (PF) 1 % IJ SOLN
30.0000 mL | INTRAMUSCULAR | Status: DC | PRN
  Filled 2017-10-09: qty 30

## 2017-10-09 NOTE — Lactation Note (Signed)
This note was copied from a baby's chart. Lactation Consultation Note  Patient Name: Melissa Burch ZOXWR'UToday's Date: 10/09/2017 Reason for consult: Initial assessment   P2, Ex BF for 21 mos.  Mother states older child weaned during pregnancy. Mother would like someone to check latch depth since she had pain initially with first child. Suggest calling RN or LC for next feeding. Reviewed basics. Mom encouraged to feed baby 8-12 times/24 hours and with feeding cues.  Mom made aware of O/P services, breastfeeding support groups, community resources, and our phone # for post-discharge questions.     Maternal Data Has patient been taught Hand Expression?: Yes Does the patient have breastfeeding experience prior to this delivery?: Yes  Feeding Feeding Type: Breast Fed Length of feed: 60 min(on and off)  LATCH Score                   Interventions    Lactation Tools Discussed/Used     Consult Status Consult Status: Follow-up Date: 10/10/17 Follow-up type: In-patient    Melissa Burch, Melissa Burch [facility] Melissa Burch 10/09/2017, 3:04 PM

## 2017-10-09 NOTE — MAU Note (Signed)
I have communicated with Philipp DeputyKim Shaw, CNM and reviewed vital signs:  Vitals:   10/09/17 0159  BP: 111/73  Pulse: 99  Resp: 18  Temp: (!) 97.3 F (36.3 C)    Vaginal exam:  Dilation: 3 Effacement (%): 70 Station: -2 Presentation: Vertex Exam by:: Data processing managerAnna Darreld Hoffer RN,   Also reviewed contraction pattern and that non-stress test is reactive.  It has been documented that patient is contracting every 5-6 minutes with no cervical change over 2 hours not indicating active labor.  Patient denies any other complaints.  Based on this report provider has given order for discharge.  A discharge order and diagnosis entered by a provider.   Labor discharge instructions reviewed with patient.

## 2017-10-09 NOTE — H&P (Signed)
OBSTETRIC ADMISSION HISTORY AND PHYSICAL  Melissa Burch is a 32 y.o. female G3P1011 with IUP at 6670w2d by LMP presenting for SROM, active labor. Mambranes stripped 1/17. She reports +FMs, No LOF, no VB, no blurry vision, headaches or peripheral edema, and RUQ pain.  She plans on breastfeeding. She request more info for birth control. [facility]   Dating: By LMP --->  Estimated Date of Delivery: 10/21/17  Sono:  @[redacted]w[redacted]d , CWD, normal anatomy, cephalic presentation, 3006g, 45%54% EFW   Prenatal History/Complications:  Past Medical History: Past Medical History:  Diagnosis Date  . Lupus   . Thyroid disease   . Vitamin D deficiency     Past Surgical History: Past Surgical History:  Procedure Laterality Date  . WISDOM TOOTH EXTRACTION      Obstetrical History: OB History    Gravida Para Term Preterm AB Living   3 1 1  0 1 1   SAB TAB Ectopic Multiple Live Births   1 0 0 0 1      Social History: Social History   Socioeconomic History  . Marital status: Married    Spouse name: Not on file  . Number of children: Not on file  . Years of education: Not on file  . Highest education level: Not on file  Social Needs  . Financial resource strain: Not on file  . Food insecurity - worry: Not on file  . Food insecurity - inability: Not on file  . Transportation needs - medical: Not on file  . Transportation needs - non-medical: Not on file  Occupational History  . Occupation: Nanny  Tobacco Use  . Smoking status: Never Smoker  . Smokeless tobacco: Never Used  Substance and Sexual Activity  . Alcohol use: Yes    Comment: not while pregnant  . Drug use: No  . Sexual activity: Yes    Partners: Male    Birth control/protection: None  Other Topics Concern  . Not on file  Social History Narrative  . Not on file    Family History: Family History  Problem Relation Age of Onset  . Hyperlipidemia Mother   . Diabetes Father   . Hearing loss Father    . Heart attack Paternal Grandmother   . Stroke Paternal Grandmother   . Diabetes Paternal Grandfather   . Hearing loss Paternal Aunt   . Hearing loss Paternal Uncle     Allergies: No Known Allergies  Medications Prior to Admission  Medication Sig Dispense Refill Last Dose  . aspirin 81 MG tablet Take 81 mg by mouth daily.   Taking  . hydroxychloroquine (PLAQUENIL) 200 MG tablet Take 200 mg by mouth 2 (two) times daily. Reported on 09/13/2015   Taking  . levothyroxine (SYNTHROID) 150 MCG tablet Take 1 tablet (150 mcg total) by mouth daily before breakfast. 30 tablet 3 Taking  . Prenatal Vit-Fe Fumarate-FA (PRENATAL MULTIVITAMIN) TABS tablet Take 1 tablet by mouth daily at 12 noon.   Taking  . triamcinolone ointment (KENALOG) 0.5 %    Taking  . Vitamin D, Ergocalciferol, 2000 units CAPS Take by mouth.   Taking     Review of Systems   All systems reviewed and negative except as stated in HPI  Last menstrual period 01/14/2017, currently breastfeeding. General appearance: alert, cooperative and appears stated age Lungs: clear to auscultation bilaterally Heart: regular rate and rhythm Abdomen: soft, non-tender; bowel sounds normal Extremities: Homans sign is negative, no sign of DVT Presentation: cephalic Fetal  monitoringBaseline: 135 bpm, Variability: Good {> 6 bpm), Accelerations: Reactive and Decelerations: Absent Uterine activityFrequency: Every 2 minutes     Prenatal labs: ABO, Rh: O/POS/-- (06/25 0913) Antibody: NEG (06/25 0913) Rubella: 5.46 (06/25 0913) RPR: NON-REACTIVE (11/14 0827)  HBsAg: NEGATIVE (06/25 0913)  HIV: NON-REACTIVE (11/14 0827)  GBS:  pos  1 hr Glucola WNL Genetic screening  WNL Anatomy US normal female  Prenatal Transfer Tool  Maternal Diabetes: No Genetic Screening: Normal Maternal Ultrasounds/Referrals: Normal Fetal Ultrasounds or other Referrals:  Referred to Materal Fetal Medicine  Maternal Substance Abuse:  No Significant Maternal  Medications:  Meds include: Syntroid Other: plaquenil Significant Maternal Lab Results: Lab values include: Group B Strep positive  No results found for this or any previous visit (from the past 24 hour(s)).  Patient Active Problem List   Diagnosis Date Noted  . GBS (group B Streptococcus carrier), +RV culture, currently pregnant 10/07/2017  . Supervision of high risk pregnancy, antepartum, first trimester 03/15/2017  . Pure hypercholesterolemia 07/25/2013  . IBS (irritable bowel syndrome) 07/25/2013  . Lupus 07/24/2013  . Hypothyroidism 07/24/2013    Assessment/Plan:  Amil Moseman is a 32 y.o. G3P1011 at [redacted]w[redacted]d here for SOL, SROM  #Labor: expectant management #Pain: Epidural at request #FWB: Reactive NST #ID:  GBS + #MOF: breast #MOC:undecided #Circ:  inpt  Alroy Bailiff, MD  10/09/2017, 6:01 AM  OB FELLOW HISTORY AND PHYSICAL ATTESTATION  I have seen and examined this patient; I agree with above documentation in the resident's note.   Pt admitted for SOL, had precipitous delivery soon after transfer to L&D. GBS not treated. Delivery uncomplicated, see delivery note.  Frederik Pear, MD OB Fellow

## 2017-10-09 NOTE — MAU Note (Signed)
Membranes stripped Friday and was 2cm. Ctxs off and on but stronger and more regular since 2200. Some bloody show.

## 2017-10-09 NOTE — Discharge Instructions (Signed)
Braxton Hicks Contractions °Contractions of the uterus can occur throughout pregnancy, but they are not always a sign that you are in labor. You may have practice contractions called Braxton Hicks contractions. These false labor contractions are sometimes confused with true labor. °What are Braxton Hicks contractions? °Braxton Hicks contractions are tightening movements that occur in the muscles of the uterus before labor. Unlike true labor contractions, these contractions do not result in opening (dilation) and thinning of the cervix. Toward the end of pregnancy (32-34 weeks), Braxton Hicks contractions can happen more often and may become stronger. These contractions are sometimes difficult to tell apart from true labor because they can be very uncomfortable. You should not feel embarrassed if you go to the hospital with false labor. °Sometimes, the only way to tell if you are in true labor is for your health care provider to look for changes in the cervix. The health care provider will do a physical exam and may monitor your contractions. If you are not in true labor, the exam should show that your cervix is not dilating and your water has not broken. °If there are other health problems associated with your pregnancy, it is completely safe for you to be sent home with false labor. You may continue to have Braxton Hicks contractions until you go into true labor. °How to tell the difference between true labor and false labor °True labor °· Contractions last 30-70 seconds. °· Contractions become very regular. °· Discomfort is usually felt in the top of the uterus, and it spreads to the lower abdomen and low back. °· Contractions do not go away with walking. °· Contractions usually become more intense and increase in frequency. °· The cervix dilates and gets thinner. °False labor °· Contractions are usually shorter and not as strong as true labor contractions. °· Contractions are usually irregular. °· Contractions  are often felt in the front of the lower abdomen and in the groin. °· Contractions may go away when you walk around or change positions while lying down. °· Contractions get weaker and are shorter-lasting as time goes on. °· The cervix usually does not dilate or become thin. °Follow these instructions at home: °· Take over-the-counter and prescription medicines only as told by your health care provider. °· Keep up with your usual exercises and follow other instructions from your health care provider. °· Eat and drink lightly if you think you are going into labor. °· If Braxton Hicks contractions are making you uncomfortable: °? Change your position from lying down or resting to walking, or change from walking to resting. °? Sit and rest in a tub of warm water. °? Drink enough fluid to keep your urine pale yellow. Dehydration may cause these contractions. °? Do slow and deep breathing several times an hour. °· Keep all follow-up prenatal visits as told by your health care provider. This is important. °Contact a health care provider if: °· You have a fever. °· You have continuous pain in your abdomen. °Get help right away if: °· Your contractions become stronger, more regular, and closer together. °· You have fluid leaking or gushing from your vagina. °· You pass blood-tinged mucus (bloody show). °· You have bleeding from your vagina. °· You have low back pain that you never had before. °· You feel your baby’s head pushing down and causing pelvic pressure. °· Your baby is not moving inside you as much as it used to. °Summary °· Contractions that occur before labor are called Braxton   Hicks contractions, false labor, or practice contractions. °· Braxton Hicks contractions are usually shorter, weaker, farther apart, and less regular than true labor contractions. True labor contractions usually become progressively stronger and regular and they become more frequent. °· Manage discomfort from Braxton Hicks contractions by  changing position, resting in a warm bath, drinking plenty of water, or practicing deep breathing. °This information is not intended to replace advice given to you by your health care provider. Make sure you discuss any questions you have with your health care provider. °Document Released: 01/21/2017 Document Revised: 01/21/2017 Document Reviewed: 01/21/2017 °Elsevier Interactive Patient Education © 2018 [employer] ° °

## 2017-10-10 LAB — CBC
HEMATOCRIT: 29.6 % — AB (ref 36.0–46.0)
Hemoglobin: 10.4 g/dL — ABNORMAL LOW (ref 12.0–15.0)
MCH: 32.4 pg (ref 26.0–34.0)
MCHC: 35.1 g/dL (ref 30.0–36.0)
MCV: 92.2 fL (ref 78.0–100.0)
Platelets: 157 10*3/uL (ref 150–400)
RBC: 3.21 MIL/uL — ABNORMAL LOW (ref 3.87–5.11)
RDW: 12.6 % (ref 11.5–15.5)
WBC: 9.9 10*3/uL (ref 4.0–10.5)

## 2017-10-10 MED ORDER — LIDOCAINE 1% INJECTION FOR CIRCUMCISION
0.8000 mL | INJECTION | Freq: Once | INTRAVENOUS | Status: DC
  Filled 2017-10-10: qty 1

## 2017-10-10 MED ORDER — WHITE PETROLATUM EX OINT: 1.0000 "application " | TOPICAL_OINTMENT | CUTANEOUS | Status: DC | PRN

## 2017-10-10 MED ORDER — EPINEPHRINE TOPICAL FOR CIRCUMCISION 0.1 MG/ML: 1.0000 [drp] | TOPICAL | Status: DC | PRN

## 2017-10-10 MED ORDER — SUCROSE 24% NICU/PEDS ORAL SOLUTION: 0.5000 mL | OROMUCOSAL | Status: DC | PRN

## 2017-10-10 MED ORDER — ACETAMINOPHEN FOR CIRCUMCISION 160 MG/5 ML: 40.0000 mg | Freq: Once | ORAL | Status: DC

## 2017-10-10 MED ORDER — ACETAMINOPHEN FOR CIRCUMCISION 160 MG/5 ML: 40.0000 mg | ORAL | Status: DC | PRN

## 2017-10-10 NOTE — Progress Notes (Signed)
POSTPARTUM PROGRESS NOTE  Post Partum Day 1  Subjective:  Melissa Burch is a 32 y.o. [id number] s/p SVD, precipitous delivery, at 6147w2d.  No acute events overnight.  Pt denies problems with ambulating, voiding or po intake.  She denies nausea or vomiting.  Pain is well controlled.  She has had flatus. She has not had bowel movement.  Lochia Minimal.   Objective: Blood pressure 106/67, pulse 74, temperature 97.9 F (36.6 C), temperature source Oral, resp. rate 18, height 5\' 6"  (1.676 m), weight 82.1 kg (181 lb), last menstrual period 01/14/2017, SpO2 99 %, unknown if currently breastfeeding.  Physical Exam:  General: alert, cooperative and no distress Chest: no respiratory distress Heart:regular rate, distal pulses intact Abdomen: soft, nontender,  Uterine Fundus: firm, appropriately tender DVT Evaluation: No calf swelling or tenderness Extremities: trace edema Skin: warm, dry  Recent Labs    10/09/17 0617  HGB 12.9  HCT 35.9*    Assessment/Plan: Melissa Burch is a 32 y.o. [id number] s/p SVD at 6447w2d   PPD#1 - Doing well Contraception: unsure Feeding: breast Dispo: Plan for discharge tomorrow.   LOS: 1 day   Alroy BailiffParker W Shawntez Dickison MD 10/10/2017, 6:22 AM

## 2017-10-11 ENCOUNTER — Encounter: Payer: 59 | Admitting: Certified Nurse Midwife

## 2017-10-11 NOTE — Discharge Summary (Signed)
OB Discharge Summary     Patient Name: Melissa Burch DOB: [birth year] MRN: [id number]  Date of admission: 10/09/2017 Delivering MD: Alroy Bailiff   Date of discharge: 10/11/2017  Admitting diagnosis: 38.2WKS WATER BROKE Intrauterine pregnancy: [redacted]w[redacted]d     Secondary diagnosis:  Principal Problem:   Supervision of high risk pregnancy, antepartum, first trimester Active Problems:   Lupus   Hypothyroidism   GBS (group B Streptococcus carrier), +RV culture, currently pregnant   Normal labor   SVD (spontaneous vaginal delivery)  Additional problems: none     Discharge diagnosis: Term Pregnancy Delivered                                                                                                Post partum procedures:none  Augmentation: none  Complications: None  Hospital course:  Onset of Labor With Vaginal Delivery     32 y.o. yo [id number] at [redacted]w[redacted]d was admitted in Active Labor on 10/09/2017. Patient had an uncomplicated labor course as follows:  Membrane Rupture Time/Date: 5:15 AM ,10/09/2017   Intrapartum Procedures: Episiotomy: None [1]                                         Lacerations:  2nd degree [3];Perineal [11]  Patient had a delivery of a Viable infant. 10/09/2017  Information for the patient's newborn:  Thai, Hemrick [[id number]]  Delivery Method: Vaginal, Spontaneous(Filed from Delivery Summary)    Pateint had an uncomplicated postpartum course.  She is ambulating, tolerating a regular diet, passing flatus, and urinating well. Patient is discharged home in stable condition on 10/11/17.   Physical exam  Vitals:   10/09/17 2000 10/10/17 0618 10/10/17 1917 10/11/17 0539  BP: 106/67 107/60 (!) 109/45 114/66  Pulse: 74 61 (!) 59 63  Resp: 18 18 18 16   Temp: 97.9 F (36.6 C) 98.1 F (36.7 C) 98.6 F (37 C) 98.2 F (36.8 C)  TempSrc: Oral Oral Oral Oral  SpO2: 99% 99%    Weight:      Height:       General: alert, cooperative and no distress Lochia:  appropriate Uterine Fundus: firm Incision: N/A DVT Evaluation: No evidence of DVT seen on physical exam. No significant calf/ankle edema. Labs: Lab Results  Component Value Date   WBC 9.9 10/10/2017   HGB 10.4 (L) 10/10/2017   HCT 29.6 (L) 10/10/2017   MCV 92.2 10/10/2017   PLT 157 10/10/2017   CMP Latest Ref Rng & Units 03/15/2017  Glucose 65 - 99 mg/dL 76  BUN 7 - 25 mg/dL 10  Creatinine 8.46 - 9.62 mg/dL 9.52  Sodium 841 - 324 mmol/L 133(L)  Potassium 3.5 - 5.3 mmol/L 4.0  Chloride 98 - 110 mmol/L 102  CO2 20 - 31 mmol/L 22  Calcium 8.6 - 10.2 mg/dL 9.6  Total Protein 6.1 - 8.1 g/dL 7.1  Total Bilirubin 0.2 - 1.2 mg/dL 0.9  Alkaline Phos 33 - 115 U/L 63  AST 10 - 30 U/L 20  ALT 6 - 29 U/L 18    Discharge instruction: per After Visit Summary and "Baby and Me Booklet".  After visit meds:  Allergies as of 10/11/2017   No Known Allergies     Medication List    STOP taking these medications   aspirin 81 MG tablet     TAKE these medications   hydroxychloroquine 200 MG tablet Commonly known as:  PLAQUENIL Take 200 mg by mouth 2 (two) times daily. Reported on 09/13/2015   levothyroxine 150 MCG tablet Commonly known as:  SYNTHROID Take 1 tablet (150 mcg total) by mouth daily before breakfast.   prenatal multivitamin Tabs tablet Take 1 tablet by mouth daily at 12 noon.   Vitamin D (Ergocalciferol) 2000 units Caps Take 1 capsule by mouth daily.       Diet: routine diet  Activity: Advance as tolerated. Pelvic rest for 6 weeks.   Outpatient follow up:4 weeks Follow up Appt:No future appointments. Follow up Visit:No Follow-up on file.  Postpartum contraception: Condoms  Newborn Data: Live born female  Birth Weight: 7 lb 5.5 oz (3330 g) APGAR: 8, 9  Newborn Delivery   Birth date/time:  10/09/2017 06:20:00 Delivery type:  Vaginal, Spontaneous     Baby Feeding: Breast Disposition:home with mother   10/11/2017 Sharyon CableVeronica C Latham Kinzler, CNM

## 2017-10-11 NOTE — Lactation Note (Signed)
This note was copied from a baby's chart. Lactation Consultation Note  Patient Name: Melissa Willene HatchetMaggie Viens MWUXL'KToday's Date: 10/11/2017 Reason for consult: Follow-up assessment Mom report good feedings.  Breasts are full this AM and mom hears good swallows.  Reviewed engorgement treatment.  Questions answered.  Reviewed lactation support and services and encouraged prn.  Maternal Data    Feeding    LATCH Score                   Interventions    Lactation Tools Discussed/Used     Consult Status      Huston FoleyMOULDEN, Devarius Nelles S 10/11/2017, 11:12 AM

## 2017-10-11 NOTE — Plan of Care (Signed)
Patient is doing well this morning, she is up and moving around well.  Patient states she is not in pain, she is able to provide care for herself and her infant well.  Patient does not present any signs of complications and appears to be healing well. Patient has not concerns this morning.

## 2017-10-13 ENCOUNTER — Ambulatory Visit (INDEPENDENT_AMBULATORY_CARE_PROVIDER_SITE_OTHER): Payer: 59 | Admitting: Obstetrics & Gynecology

## 2017-10-13 VITALS — BP 108/68 | HR 66 | Temp 96.6°F | Ht 66.0 in | Wt 172.0 lb

## 2017-10-13 DIAGNOSIS — N6459 Other signs and symptoms in breast: Secondary | ICD-10-CM

## 2017-10-13 MED ORDER — DICLOXACILLIN SODIUM 250 MG PO CAPS: ORAL_CAPSULE | ORAL | 0 refills | Status: DC

## 2017-10-13 NOTE — Progress Notes (Signed)
Patient is 4 days postpartum.Patient states she had fever and chills yesterday. Patient states temperature was 100 F yesterday. Melissa StammerJennifer Kalana Yust RN

## 2017-10-14 ENCOUNTER — Encounter: Payer: Self-pay | Admitting: Obstetrics & Gynecology

## 2017-10-14 ENCOUNTER — Inpatient Hospital Stay (HOSPITAL_COMMUNITY): Admission: RE | Admit: 2017-10-14 | Payer: 59 | Source: Ambulatory Visit | Admitting: Obstetrics and Gynecology

## 2017-10-14 NOTE — Progress Notes (Signed)
   Subjective:    Patient ID: Melissa Burch, female    DOB: [birth year], 32 y.o.   MRN: [id number]  HPI  Pt presents 4 days post delviery with breast pain yesterday, body aches, T=100.0.  Pt's milk had come I nand she was engorged.  With several feeds, she was able to relieve the discomfort.  Today she has no pain, no fever, no body aches.    Review of Systems  Constitutional: Positive for fatigue.  Genitourinary: Positive for vaginal pain.  Musculoskeletal: Positive for arthralgias.       Objective:   Physical Exam  Constitutional: She is oriented to person, place, and time. She appears well-developed and well-nourished. No distress.  HENT:  Head: Normocephalic and atraumatic.  Eyes: Conjunctivae are normal.  Pulmonary/Chest: Effort normal.  Abdominal: Soft. Bowel sounds are normal. She exhibits no distension and no mass. There is no tenderness. There is no rebound and no guarding.  Genitourinary:  Genitourinary Comments: Perineum is intact, no evidence of infetion Uterus is non tender Nml lochia  Breasts are NT, no redness.    Musculoskeletal: She exhibits no edema.  Neurological: She is alert and oriented to person, place, and time.  Skin: Skin is warm and dry.  Psychiatric: She has a normal mood and affect.  Vitals reviewed.  Vitals:   10/13/17 0901  BP: 108/68  Pulse: 66  Temp: (!) 96.6 F (35.9 C)  Weight: 172 lb (78 kg)  Height: 5\' 6"  (1.676 m)    Assessment & Plan:  32 yo female 4 days post partum with breast engorgement now resolved.  Had symptoms of mastitis but now resolved with breast emptying.  1.  Warning signs of mastitis reviewed. 2.  Antibiotics if fever returns with breast pain. 3.  If becomes engorged, pump to pain relief. 4.  No evidence of laceration breakdown nor endometritis.

## 2017-10-25 ENCOUNTER — Encounter: Payer: Self-pay | Admitting: Obstetrics & Gynecology

## 2017-10-25 ENCOUNTER — Ambulatory Visit (INDEPENDENT_AMBULATORY_CARE_PROVIDER_SITE_OTHER): Payer: 59 | Admitting: Obstetrics & Gynecology

## 2017-10-25 NOTE — Progress Notes (Signed)
   Subjective:    Patient ID: Melissa Burch, female    DOB: 1986-04-24, 32 y.o.   MRN: [id number]  HPI 32 yo married P2 here 2 weeks after a NSVD to have her perineum checked. She had a 2nd degree tear with this delivery. She had a 4th degree tear with her first child. The breast feeding is going well. She has some constipation.   Review of Systems     Objective:   Physical Exam Breathing, conversing, and ambulating normally Well nourished, well hydrated White female, no apparent distress Her vulva is healing well.     Assessment & Plan:  Postpartum- stable Reassurance given Rec Miralax prn

## 2017-11-09 ENCOUNTER — Encounter: Payer: Self-pay | Admitting: Obstetrics & Gynecology

## 2017-11-09 ENCOUNTER — Ambulatory Visit (INDEPENDENT_AMBULATORY_CARE_PROVIDER_SITE_OTHER): Payer: 59 | Admitting: Obstetrics & Gynecology

## 2017-11-09 DIAGNOSIS — Z1389 Encounter for screening for other disorder: Secondary | ICD-10-CM

## 2017-11-09 DIAGNOSIS — Z124 Encounter for screening for malignant neoplasm of cervix: Secondary | ICD-10-CM | POA: Diagnosis not present

## 2017-11-09 DIAGNOSIS — Z1151 Encounter for screening for human papillomavirus (HPV): Secondary | ICD-10-CM | POA: Diagnosis not present

## 2017-11-09 MED ORDER — LEVOTHYROXINE SODIUM 125 MCG PO TABS: 125.0000 ug | ORAL_TABLET | Freq: Every day | ORAL | 3 refills | Status: DC

## 2017-11-09 NOTE — Progress Notes (Signed)
Post Partum Exam  Melissa HatchetMaggie Stalker is a 32 y.o. [id number] female who presents for a postpartum visit. She is 4 weeks postpartum following a spontaneous vaginal delivery. I have fully reviewed the prenatal and intrapartum course. The delivery was at 38 gestational weeks.  Anesthesia: none. Postpartum course has been unremarkable. Baby's course has been unremarkable. Baby is feeding by breast. Bleeding no bleeding. Bowel function is normal. Bladder function is normal. Patient is not sexually active. Contraception method is none. Postpartum depression screening:neg.  She saw her mental health provider today and feels much better discussing stressors of parenting.  Pt has some discomfort where stitches are from 2nd degree.  She thinks it is coming together more.  No intercourse yet.    The following portions of the patient's history were reviewed and updated as appropriate: allergies, current medications, past family history, past medical history, past social history, past surgical history and problem list. Last pap smear done 11/2014 and was Normal  Review of Systems Pertinent items noted in HPI and remainder of comprehensive ROS otherwise negative.    Objective:  Last menstrual period 01/14/2017, currently breastfeeding.  General:  alert, cooperative and no distress   Breasts:  inspection negative, no nipple discharge or bleeding, no masses or nodularity palpable  Lungs: clear to auscultation bilaterally  Heart:  regular rate and rhythm  Abdomen: soft, non-tender; bowel sounds normal; no masses,  no organomegaly   Vulva:  positive for partial superficial separation of perineum.  no infection.  shallow, mildly tender to touch.  Vagina: normal vagina, no discharge, exudate, lesion, or erythema  Cervix:  no bleeding following Pap, no cervical motion tenderness and no lesions  Corpus: normal size, contour, position, consistency, mobility, non-tender  Adnexa:  normal adnexa  Rectal Exam: Not performed.         Assessment:     postpartum exam. Pap smear done at today's visit.   Plan:   1. Contraception: plans POPs.  Not given yet.  Wants to wait until next visit 2. Perineal separation--no intercourse; recheck in 2 weeks.  Very shallow.  Has come together in past 2 weeks (pt visually inspecting at home.   3. Follow up in: 2 weeks or as needed.  4.  Return to prepregnancy dose of synthroid.

## 2017-11-12 LAB — CYTOLOGY - PAP
Diagnosis: NEGATIVE
HPV (WINDOPATH): NOT DETECTED

## 2017-11-15 ENCOUNTER — Telehealth: Payer: Self-pay

## 2017-11-15 NOTE — Telephone Encounter (Signed)
Spoke with pt and she is aware that pap smear is normal

## 2017-11-23 ENCOUNTER — Encounter: Payer: Self-pay | Admitting: Obstetrics and Gynecology

## 2017-11-23 ENCOUNTER — Ambulatory Visit (INDEPENDENT_AMBULATORY_CARE_PROVIDER_SITE_OTHER): Payer: 59 | Admitting: Obstetrics and Gynecology

## 2017-11-23 MED ORDER — NORETHINDRONE 0.35 MG PO TABS: 1.0000 | ORAL_TABLET | Freq: Every day | ORAL | 11 refills | Status: DC

## 2017-11-23 NOTE — Progress Notes (Signed)
32 yo G3P2012 here for perineal check. Patient s/p vaginal delivery on 10/09/2017 with second degree laceration. Patient is breastfeeding. She has not been sexually active. She desires POP for contraception  Vitals:   11/23/17 1531  BP: 115/76  Pulse: 76  Resp: 16    GENERAL: Well-developed, well-nourished female in no acute distress.  ABDOMEN: Soft, nontender, nondistended. No organomegaly. PELVIC: Normal external female genitalia. Vagina is pink and rugated and reapproxiamted. Small area of superficial skin separation at the fourchette less than 0.5 cm. Siver nitrate applied  Normal discharge. Normal appearing cervix. Uterus is normal in size. No adnexal mass or tenderness. EXTREMITIES: No cyanosis, clubbing, or edema, 2+ distal pulses.  A/P 32 yo s/p SVD on 10/09/2017 here for perineal inspection of 2nd degree laceration - laceration completely healed - Rx POP provided. Patient to abstain or use condoms for the next 2 weeks following initiation  - patient is medically cleared to resume all activities of daily living including intercourse  - Normal pap smear 10/2017 - RTC prn

## 2018-06-02 ENCOUNTER — Other Ambulatory Visit: Payer: Self-pay | Admitting: Obstetrics & Gynecology

## 2018-08-02 ENCOUNTER — Other Ambulatory Visit: Payer: Self-pay

## 2018-08-02 ENCOUNTER — Telehealth: Payer: Self-pay

## 2018-08-02 MED ORDER — LEVOTHYROXINE SODIUM 125 MCG PO TABS: ORAL_TABLET | ORAL | 0 refills | Status: DC

## 2018-08-02 NOTE — Telephone Encounter (Signed)
Ok to refill again and try to get labs from rheumatology to see if we could give longer refills.

## 2018-08-02 NOTE — Telephone Encounter (Signed)
Called and notified patient and I also sent over a refill of medications to pharmacy. No further questions or concerns at this time.

## 2018-08-02 NOTE — Telephone Encounter (Signed)
Patient called today saying that she is in need of another refill on her Levothyroxine. She also said she knows she is due for lab work, but she recently saw her rheumatologist and had lab work done there, and was wondering if we could use the labs from there to refill her medication? Thanks!

## 2018-09-07 ENCOUNTER — Other Ambulatory Visit: Payer: Self-pay | Admitting: Physician Assistant

## 2018-10-13 ENCOUNTER — Other Ambulatory Visit: Payer: Self-pay | Admitting: Physician Assistant

## 2018-11-15 ENCOUNTER — Other Ambulatory Visit: Payer: Self-pay | Admitting: Physician Assistant

## 2018-12-02 ENCOUNTER — Other Ambulatory Visit: Payer: Self-pay | Admitting: Physician Assistant

## 2018-12-16 ENCOUNTER — Telehealth: Payer: Self-pay

## 2018-12-16 MED ORDER — LEVOTHYROXINE SODIUM 125 MCG PO TABS: ORAL_TABLET | ORAL | 1 refills | Status: DC

## 2018-12-16 NOTE — Telephone Encounter (Signed)
Done

## 2018-12-16 NOTE — Telephone Encounter (Signed)
Left pt msg advising RX sent  

## 2018-12-16 NOTE — Telephone Encounter (Signed)
Patient called stating that she is aware she is due for labs, but wants to know if Lesly Rubenstein can write her a script to last a couple months due to COVID precautions so she can come in and have labs done when all this passes. Pt concerned because she states she would have to bring her two small children with her to the lab to be drawn, and she does not feel that is safe.   Patient has not been seen by PCP since 12/01/16..  Last TSH lab done 09/23/18 and it was ordered by Dr Elsie Lincoln  Please advise.Marland Kitchen

## 2019-02-03 IMAGING — US US MFM FETAL NUCHAL TRANSLUCENCY
1 series · 15 of 28 positions shown · non-contrast
Comparison: none

[Series 1: us mfm fetal nuchal translucency · 15 of 33 slices shown]
[im 1/33]
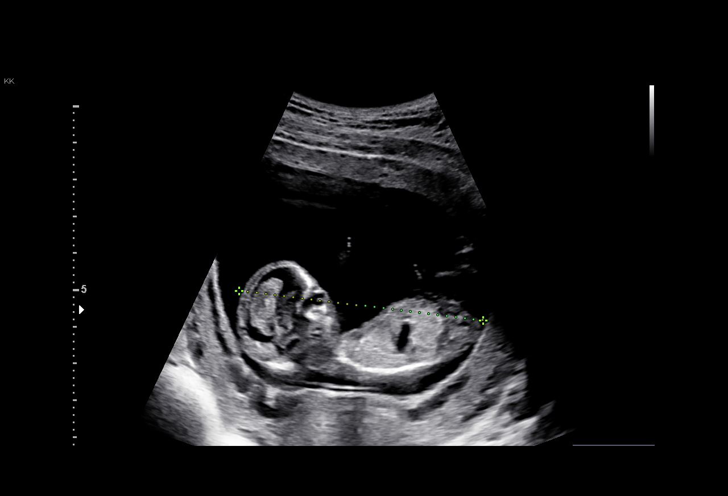
[im 3/33]
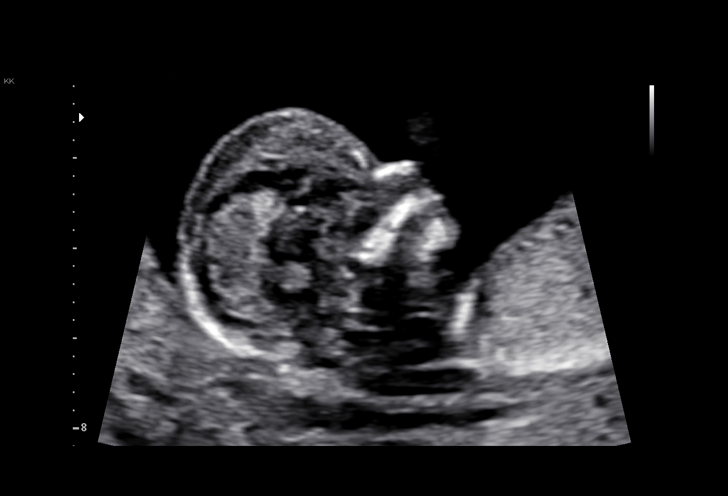
[im 5/33]
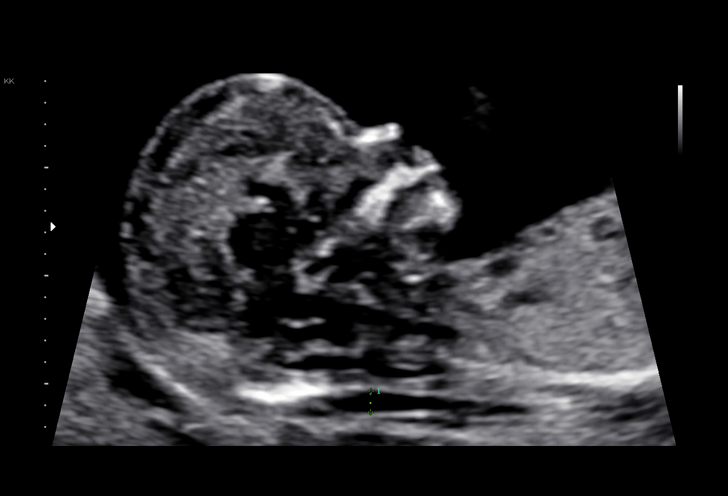
[im 8/33]
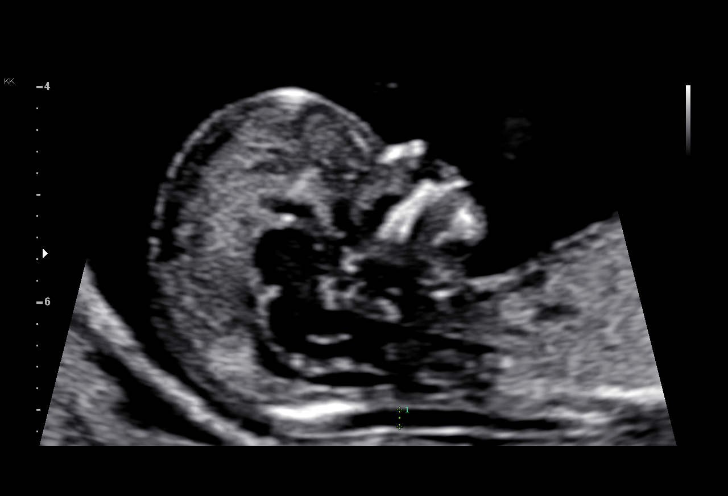
[im 10/33]
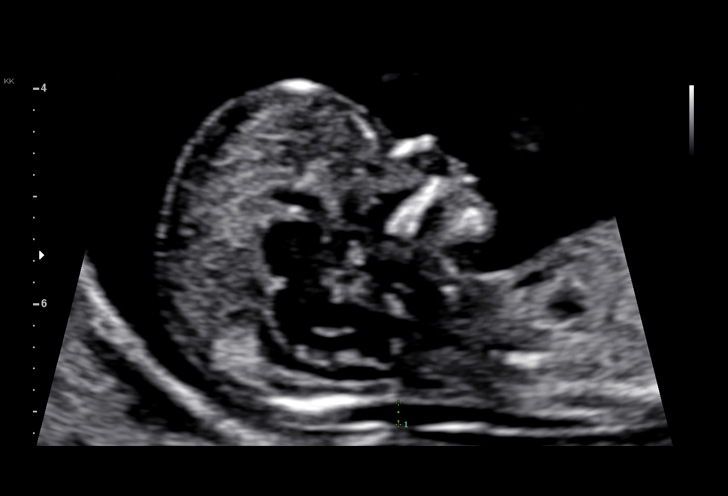
[im 12/33]
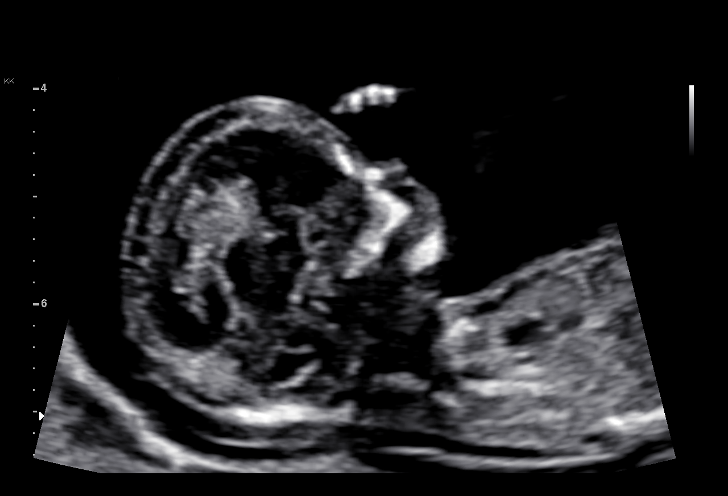
[im 15/33]
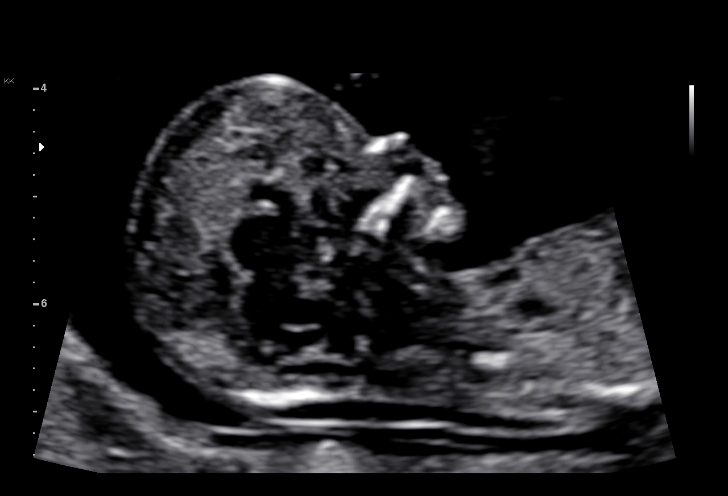
[im 17/33]
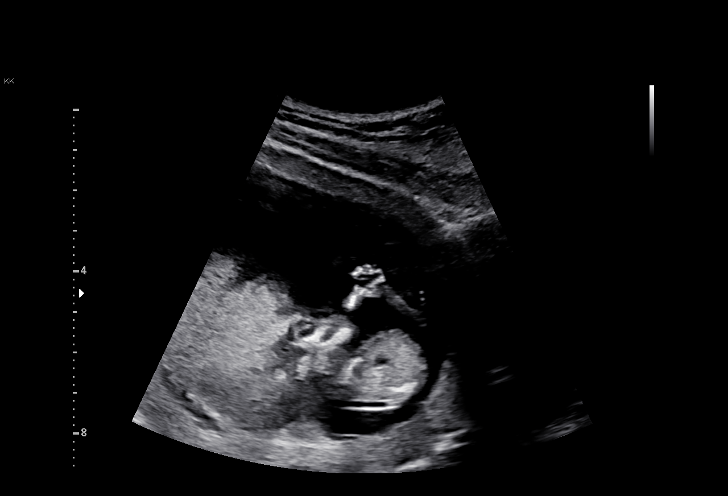
[im 18/33]
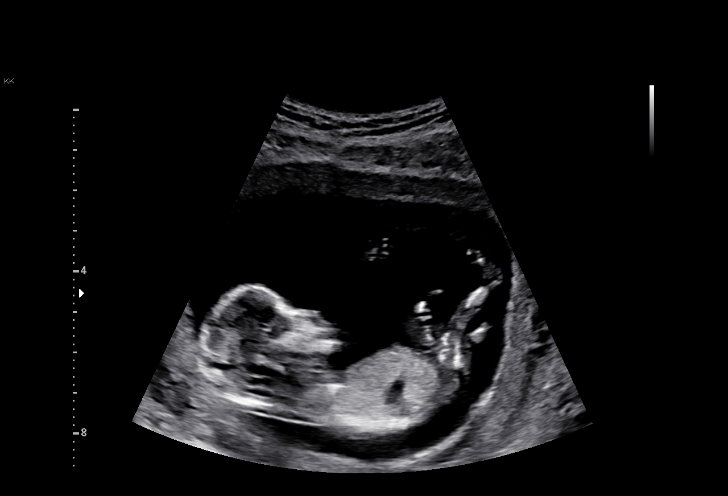
[im 21/33]
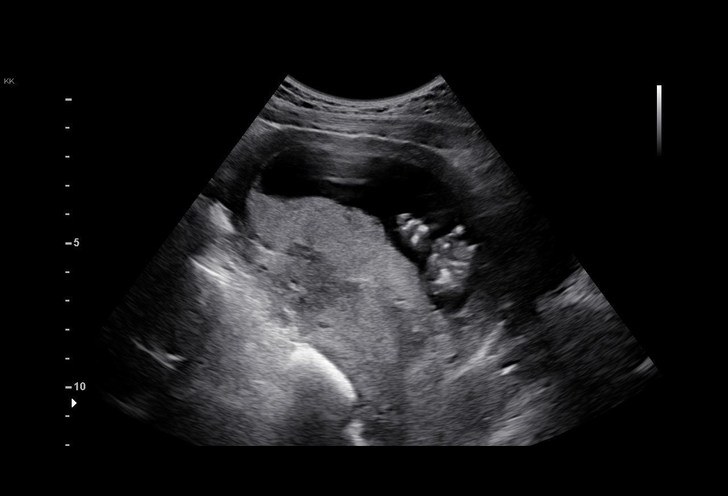
[im 23/33]
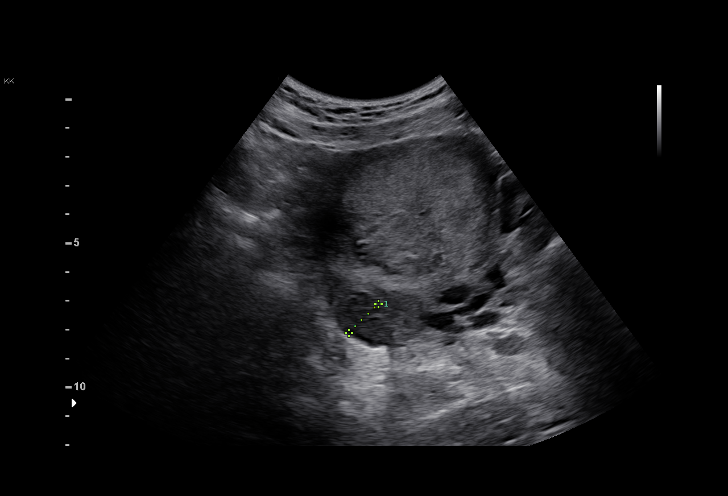
[im 25/33]
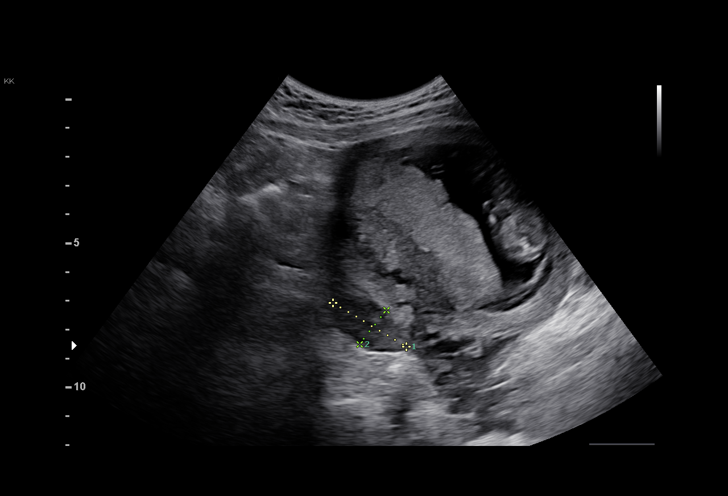
[im 28/33]
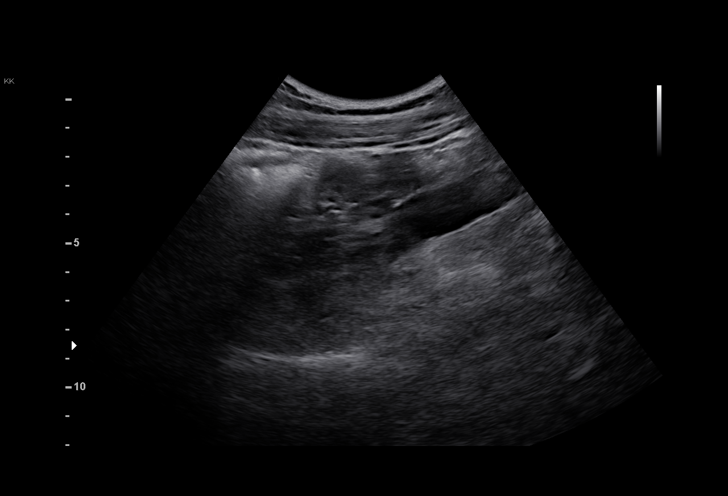
[im 30/33]
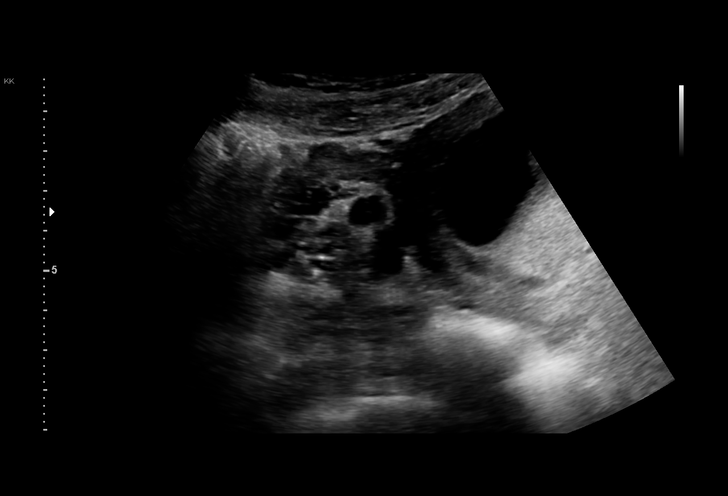
[im 33/33]
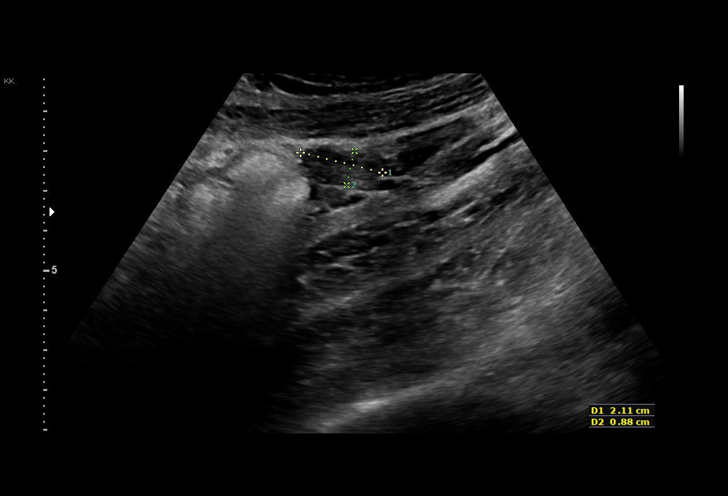

[15 of 28 positions shown; findings below may reference images not displayed]

[REDACTED]

TRANSLUCENCY

Indications

12 weeks gestation of pregnancy
Encounter for nuchal translucency
Systemic lupus complicating pregnancy, first   O26.891,
trimester; Plaquenil
Thyroid disease in pregnancy; Synthroid        O99.280,
OB History

Blood Type:            Height:  5'6"   Weight (lb):  159      BMI:
Gravidity:    3         Term:   1        Prem:   0        SAB:   1
TOP:          0       Ectopic:  0        Living: 1
Fetal Evaluation

Num Of Fetuses:     1
Fetal Heart         160
Rate(bpm):
Cardiac Activity:   Observed
Presentation:       Variable

Amniotic Fluid
AFI FV:      Subjectively within normal limits
Gestational Age

LMP:           12w 6d       Date:   01/14/17                 EDD:   10/21/17
Best:          12w 6d    Det. By:   LMP  (01/14/17)          EDD:   10/21/17
1st Trimester Genetic Sonogram Screening
CRL:            66.7  mm    G. Age:   12w 6d                 EDD:   10/21/17
Nuc Trans:       2.0  mm

Nasal Bone:                 Present
Cervix Uterus Adnexa

Cervix
Normal appearance by transabdominal scan.

Uterus
No abnormality visualized.

Left Ovary
Within normal limits.

Right Ovary
Within normal limits.
Impression

SIUP at 12+6 weeks
No gross abnormalities identified
NT measurement was within normal limits for this GA; NB
present
Normal amniotic fluid volume
Measurements consistent with LMP dating
Recommendations

Offer MSAFP in the second trimester for ONTD screening
Offer anatomy U/S by 18 weeks

## 2019-02-21 ENCOUNTER — Telehealth: Payer: Self-pay | Admitting: Neurology

## 2019-02-21 NOTE — Telephone Encounter (Signed)
Patient left vm regarding needing lab work prior to refill of Levothyroxine. She states her rheumatologist at [facility] just ordered labs not too long ago. In care everywhere it looks like last TSH was drawn on 01/19/2019 at 4.290. Okay to schedule for virtual visit for refills on meds?

## 2019-02-22 MED ORDER — LEVOTHYROXINE SODIUM 125 MCG PO TABS: ORAL_TABLET | ORAL | 1 refills | Status: DC

## 2019-02-22 NOTE — Telephone Encounter (Signed)
Lets just do an in person visit in 6 months. I sent refills.

## 2019-02-22 NOTE — Telephone Encounter (Signed)
Patient made aware.

## 2019-04-12 ENCOUNTER — Encounter: Payer: Self-pay | Admitting: *Deleted

## 2019-04-12 DIAGNOSIS — Z3480 Encounter for supervision of other normal pregnancy, unspecified trimester: Secondary | ICD-10-CM | POA: Insufficient documentation

## 2019-04-17 ENCOUNTER — Other Ambulatory Visit: Payer: Self-pay

## 2019-04-17 ENCOUNTER — Other Ambulatory Visit: Payer: 59

## 2019-04-17 ENCOUNTER — Ambulatory Visit (INDEPENDENT_AMBULATORY_CARE_PROVIDER_SITE_OTHER): Payer: 59 | Admitting: Obstetrics & Gynecology

## 2019-04-17 ENCOUNTER — Encounter: Payer: Self-pay | Admitting: Obstetrics & Gynecology

## 2019-04-17 DIAGNOSIS — Z113 Encounter for screening for infections with a predominantly sexual mode of transmission: Secondary | ICD-10-CM

## 2019-04-17 DIAGNOSIS — Z3480 Encounter for supervision of other normal pregnancy, unspecified trimester: Secondary | ICD-10-CM

## 2019-04-17 DIAGNOSIS — Z3481 Encounter for supervision of other normal pregnancy, first trimester: Secondary | ICD-10-CM | POA: Diagnosis not present

## 2019-04-17 DIAGNOSIS — Z3A10 10 weeks gestation of pregnancy: Secondary | ICD-10-CM | POA: Diagnosis not present

## 2019-04-17 NOTE — Progress Notes (Signed)
Bedside U/S shows single IUP with FHT of 175 BPM and CRL measures 33.66mm  GA is [redacted]w[redacted]d

## 2019-04-17 NOTE — Progress Notes (Signed)
  Subjective:    Melissa Burch is a [id number] [redacted]w[redacted]d being seen today for her first obstetrical visit.  Her obstetrical history is significant for Lupus in remission (10 years). Patient does intend to breast feed. Pregnancy history fully reviewed.  Patient reports no complaints.  Vitals:   04/17/19 0956  BP: 110/66  Pulse: 76  Weight: 153 lb (69.4 kg)    HISTORY: OB History  Gravida Para Term Preterm AB Living  4 2 2  0 1 2  SAB TAB Ectopic Multiple Live Births  1 0 0 0 2    # Outcome Date GA Lbr Len/2nd Weight Sex Delivery Anes PTL Lv  4 Current           3 Term 10/09/17 [redacted]w[redacted]d 01:02 / 00:03 7 lb 5.5 oz (3.33 kg) M Vag-Spont None  LIV  2 Term 09/19/15 [redacted]w[redacted]d 06:30 / 03:13 6 lb 11.1 oz (3.035 kg) F Vag-Vacuum EPI  LIV  1 SAB            Past Medical History:  Diagnosis Date  . Hypothyroidism   . Lupus ([place])   . Thyroid disease   . Vitamin D deficiency    Past Surgical History:  Procedure Laterality Date  . NO PAST SURGERIES    . WISDOM TOOTH EXTRACTION     Family History  Problem Relation Age of Onset  . Hyperlipidemia Mother   . Diabetes Father   . Hearing loss Father   . Heart attack Paternal Grandmother   . Stroke Paternal Grandmother   . Diabetes Paternal Grandfather   . Hearing loss Paternal Aunt   . Hearing loss Paternal Uncle      Exam    Uterus:     Pelvic Exam:    Perineum: No Hemorrhoids   Vulva: normal   Vagina:  normal mucosa   pH: n/a   Cervix: no cervical motion tenderness   Adnexa: normal adnexa   Bony Pelvis: average  System: Breast:  normal appearance, no masses or tenderness   Skin: normal coloration and turgor, no rashes    Neurologic: oriented, normal mood   Extremities: normal strength, tone, and muscle mass   HEENT extra ocular movement intact, sclera clear, anicteric, neck supple with midline trachea and thyroid without masses   Mouth/Teeth n/a   Neck supple and no masses   Cardiovascular: regular rate and rhythm   Respiratory:   appears well, vitals normal, no respiratory distress, acyanotic, normal RR, chest clear, no wheezing, crepitations, rhonchi, normal symmetric air entry   Abdomen: soft, non-tender; bowel sounds normal; no masses,  no organomegaly   Urinary: urethral meatus normal      Assessment:    Pregnancy: [id number] Patient Active Problem List   Diagnosis Date Noted  . Supervision of other normal pregnancy, antepartum 04/12/2019  . Laceration, obstetrical, second degree 11/09/2017  . Pure hypercholesterolemia 07/25/2013  . IBS (irritable bowel syndrome) 07/25/2013  . Lupus ([place]) 07/24/2013  . Hypothyroidism 07/24/2013        Plan:     Initial labs drawn. Prenatal vitamins. Problem list reviewed and updated.  1.  Genetic screening--cf DNA and AFP at 15 weeks 2.  [place] 18-20 weeks 3.  Check TSH q trimester 4.  Check Vitamin D 5.  Continue Paquenil 6.  Babyscripts optimized schedule     Silas [place] 04/17/2019

## 2019-04-18 LAB — OBSTETRIC PANEL
Absolute Monocytes: 576 cells/uL (ref 200–950)
Antibody Screen: NOT DETECTED
Basophils Absolute: 38 cells/uL (ref 0–200)
Basophils Relative: 0.6 %
Eosinophils Absolute: 614 cells/uL — ABNORMAL HIGH (ref 15–500)
Eosinophils Relative: 9.6 %
HCT: 40.3 % (ref 35.0–45.0)
Hemoglobin: 13.4 g/dL (ref 11.7–15.5)
Hepatitis B Surface Ag: NONREACTIVE
Lymphs Abs: 1530 cells/uL (ref 850–3900)
MCH: 32.1 pg (ref 27.0–33.0)
MCHC: 33.3 g/dL (ref 32.0–36.0)
MCV: 96.6 fL (ref 80.0–100.0)
MPV: 9.3 fL (ref 7.5–12.5)
Monocytes Relative: 9 %
Neutro Abs: 3642 cells/uL (ref 1500–7800)
Neutrophils Relative %: 56.9 %
Platelets: 271 10*3/uL (ref 140–400)
RBC: 4.17 10*6/uL (ref 3.80–5.10)
RDW: 11.7 % (ref 11.0–15.0)
RPR Ser Ql: NONREACTIVE
Rubella: 5.59 index
Total Lymphocyte: 23.9 %
WBC: 6.4 10*3/uL (ref 3.8–10.8)

## 2019-04-18 LAB — HIV ANTIBODY (ROUTINE TESTING W REFLEX): HIV 1&2 Ab, 4th Generation: NONREACTIVE

## 2019-04-18 LAB — TSH: TSH: 17.73 mIU/L — ABNORMAL HIGH

## 2019-04-18 LAB — CERVICOVAGINAL ANCILLARY ONLY
Chlamydia: NEGATIVE
Neisseria Gonorrhea: NEGATIVE

## 2019-04-20 LAB — CULTURE, OB URINE

## 2019-04-20 LAB — URINE CULTURE, OB REFLEX

## 2019-04-21 ENCOUNTER — Other Ambulatory Visit: Payer: Self-pay | Admitting: Obstetrics & Gynecology

## 2019-04-21 DIAGNOSIS — O99280 Endocrine, nutritional and metabolic diseases complicating pregnancy, unspecified trimester: Secondary | ICD-10-CM

## 2019-04-21 DIAGNOSIS — E039 Hypothyroidism, unspecified: Secondary | ICD-10-CM

## 2019-04-21 NOTE — Progress Notes (Signed)
TSH ordered for mid September--6 weeks after patient restarted meds.

## 2019-04-22 LAB — VITAMIN D 1,25 DIHYDROXY
Vitamin D 1, 25 (OH)2 Total: 52 pg/mL (ref 18–72)
Vitamin D2 1, 25 (OH)2: <8 pg/mL
Vitamin D3 1, 25 (OH)2: 52 pg/mL

## 2019-05-11 ENCOUNTER — Other Ambulatory Visit: Payer: 59

## 2019-05-11 ENCOUNTER — Telehealth: Payer: Self-pay | Admitting: *Deleted

## 2019-05-11 NOTE — Telephone Encounter (Signed)
Left patient a message to call the office to answer updated screening questions prior to her appointment on 05/11/2019 at 3:00pm.

## 2019-05-12 ENCOUNTER — Other Ambulatory Visit: Payer: Self-pay

## 2019-05-12 ENCOUNTER — Other Ambulatory Visit (INDEPENDENT_AMBULATORY_CARE_PROVIDER_SITE_OTHER): Payer: 59 | Admitting: *Deleted

## 2019-05-12 DIAGNOSIS — E039 Hypothyroidism, unspecified: Secondary | ICD-10-CM

## 2019-05-12 NOTE — Progress Notes (Signed)
Pt requested a TSH because she stated that she was really struggling with fatique.  She has recently gone back on 125 mcg of Synthroid.  She was suppose to return in 6 weeks for TSH but called and really wanted to do it sooner.

## 2019-05-13 LAB — TSH: TSH: 3.41 mIU/L

## 2019-05-30 ENCOUNTER — Other Ambulatory Visit: Payer: Self-pay

## 2019-05-30 ENCOUNTER — Ambulatory Visit (INDEPENDENT_AMBULATORY_CARE_PROVIDER_SITE_OTHER): Payer: 59 | Admitting: Advanced Practice Midwife

## 2019-05-30 VITALS — BP 108/59 | HR 69 | Wt 156.0 lb

## 2019-05-30 DIAGNOSIS — E039 Hypothyroidism, unspecified: Secondary | ICD-10-CM

## 2019-05-30 DIAGNOSIS — Z3480 Encounter for supervision of other normal pregnancy, unspecified trimester: Secondary | ICD-10-CM

## 2019-05-30 DIAGNOSIS — Z3A19 19 weeks gestation of pregnancy: Secondary | ICD-10-CM

## 2019-05-30 DIAGNOSIS — O9989 Other specified diseases and conditions complicating pregnancy, childbirth and the puerperium: Secondary | ICD-10-CM

## 2019-05-30 DIAGNOSIS — O99282 Endocrine, nutritional and metabolic diseases complicating pregnancy, second trimester: Secondary | ICD-10-CM | POA: Diagnosis not present

## 2019-05-30 DIAGNOSIS — O99891 Other specified diseases and conditions complicating pregnancy: Secondary | ICD-10-CM

## 2019-05-30 DIAGNOSIS — M329 Systemic lupus erythematosus, unspecified: Secondary | ICD-10-CM

## 2019-05-30 NOTE — Progress Notes (Signed)
   PRENATAL VISIT NOTE  Subjective:  Melissa Burch is a 33 y.o. [phone] at [redacted]w[redacted]d being seen today for ongoing prenatal care.  She is currently monitored for the following issues for this low-risk pregnancy and has Systemic lupus complicating pregnancy ([place]); Hypothyroid in pregnancy, antepartum, second trimester; Pure hypercholesterolemia; IBS (irritable bowel syndrome); Laceration, obstetrical, second degree; Supervision of other normal pregnancy, antepartum; and [redacted] weeks gestation of pregnancy on their problem list.  Patient reports no complaints.  Contractions: Not present. Vag. Bleeding: None.  Movement: Present. Denies leaking of fluid.   The following portions of the patient's history were reviewed and updated as appropriate: allergies, current medications, past family history, past medical history, past social history, past surgical history and problem list.   Objective:   Vitals:   05/30/19 0952  BP: (!) 108/59  Pulse: 69  Weight: 70.8 kg    Fetal Status: Fetal Heart Rate (bpm): 155   Movement: Present     General:  Alert, oriented and cooperative. Patient is in no acute distress.  Skin: Skin is warm and dry. No rash noted.   Cardiovascular: Normal heart rate noted  Respiratory: Normal respiratory effort, no problems with respiration noted  Abdomen: Soft, gravid, appropriate for gestational age.  Pain/Pressure: Absent     Pelvic: Cervical exam deferred        Extremities: Normal range of motion.  Edema: None  Mental Status: Normal mood and affect. Normal behavior. Normal judgment and thought content.   Assessment and Plan:  Pregnancy: [id number] at [redacted]w[redacted]d 1. Hypothyroid in pregnancy, antepartum, second trimester -Pt reports symptoms of hypothyroid have improved. Had early TSH done on 8/21 due to feelings of constant fatigue. TSH was normal. Will redraw today along with other labs   - TSH - Vitamin D 1,25 dihydroxy - Alpha fetoprotein, maternal - Genetic Screening - US OB  DETAIL + 26 WK; Future  2. [redacted] weeks gestation of pregnancy -Anatomy scan ordered today  - US OB DETAIL + 5 WK; Future  3. Systemic lupus complicating pregnancy ([place]) -In remission, not on medication for Lupus -Pt started BASA at 12 weeks. Pt has questions about when to stop BASA. Discussed at length the evidence of continuing BASA until delivery  - US OB DETAIL + 14 WK; Future  4. Supervision of other normal pregnancy, antepartum -Pt doing well today. No complaints -Anticipatory guidance for upcoming appointments   Preterm labor symptoms and general obstetric precautions including but not limited to vaginal bleeding, contractions, leaking of fluid and fetal movement were reviewed in detail with the patient. Please refer to After Visit Summary for other counseling recommendations.   No follow-ups on file.  Future Appointments  Date Time Provider [place]  06/28/2019  9:30 AM Emily Filbert, MD [facility]   Follow up in 4 weeks for ROB or as needed  Maryagnes Amos, SNM

## 2019-06-02 LAB — ALPHA FETOPROTEIN, MATERNAL
AFP MoM: 1.31
AFP, Serum: 43.2 ng/mL
Calc'd Gestational Age: 16.3 weeks
Maternal Wt: 156 [lb_av]
Risk for ONTD: 1
Twins-AFP: 1

## 2019-06-02 LAB — VITAMIN D 1,25 DIHYDROXY
Vitamin D 1, 25 (OH)2 Total: 80 pg/mL — ABNORMAL HIGH (ref 18–72)
Vitamin D2 1, 25 (OH)2: <8 pg/mL
Vitamin D3 1, 25 (OH)2: 80 pg/mL

## 2019-06-02 LAB — TSH: TSH: 2.66 mIU/L

## 2019-06-09 ENCOUNTER — Encounter: Payer: Self-pay | Admitting: *Deleted

## 2019-06-09 DIAGNOSIS — Z3480 Encounter for supervision of other normal pregnancy, unspecified trimester: Secondary | ICD-10-CM

## 2019-06-13 DIAGNOSIS — Z3480 Encounter for supervision of other normal pregnancy, unspecified trimester: Secondary | ICD-10-CM

## 2019-06-19 ENCOUNTER — Other Ambulatory Visit (HOSPITAL_COMMUNITY): Payer: Self-pay | Admitting: *Deleted

## 2019-06-19 ENCOUNTER — Ambulatory Visit (HOSPITAL_COMMUNITY): Payer: 59 | Admitting: *Deleted

## 2019-06-19 ENCOUNTER — Ambulatory Visit (HOSPITAL_COMMUNITY)
Admission: RE | Admit: 2019-06-19 | Discharge: 2019-06-19 | Disposition: A | Discharge status: Home / Self Care | Payer: 59 | Source: Ambulatory Visit | Attending: Obstetrics and Gynecology | Admitting: Obstetrics and Gynecology

## 2019-06-19 ENCOUNTER — Other Ambulatory Visit: Payer: Self-pay

## 2019-06-19 ENCOUNTER — Encounter (HOSPITAL_COMMUNITY): Payer: Self-pay

## 2019-06-19 VITALS — BP 117/61 | HR 70 | Temp 98.8°F

## 2019-06-19 DIAGNOSIS — E039 Hypothyroidism, unspecified: Secondary | ICD-10-CM | POA: Diagnosis present

## 2019-06-19 DIAGNOSIS — M329 Systemic lupus erythematosus, unspecified: Secondary | ICD-10-CM | POA: Insufficient documentation

## 2019-06-19 DIAGNOSIS — O0990 Supervision of high risk pregnancy, unspecified, unspecified trimester: Secondary | ICD-10-CM | POA: Diagnosis present

## 2019-06-19 DIAGNOSIS — Z3A19 19 weeks gestation of pregnancy: Secondary | ICD-10-CM | POA: Diagnosis present

## 2019-06-19 DIAGNOSIS — Z3480 Encounter for supervision of other normal pregnancy, unspecified trimester: Secondary | ICD-10-CM

## 2019-06-19 DIAGNOSIS — O99282 Endocrine, nutritional and metabolic diseases complicating pregnancy, second trimester: Secondary | ICD-10-CM | POA: Diagnosis not present

## 2019-06-19 DIAGNOSIS — O9989 Other specified diseases and conditions complicating pregnancy, childbirth and the puerperium: Secondary | ICD-10-CM | POA: Diagnosis present

## 2019-06-28 ENCOUNTER — Telehealth (INDEPENDENT_AMBULATORY_CARE_PROVIDER_SITE_OTHER): Payer: 59 | Admitting: Obstetrics & Gynecology

## 2019-06-28 ENCOUNTER — Other Ambulatory Visit: Payer: Self-pay

## 2019-06-28 ENCOUNTER — Telehealth: Payer: Self-pay | Admitting: *Deleted

## 2019-06-28 DIAGNOSIS — Z3480 Encounter for supervision of other normal pregnancy, unspecified trimester: Secondary | ICD-10-CM

## 2019-06-28 DIAGNOSIS — Z3A20 20 weeks gestation of pregnancy: Secondary | ICD-10-CM

## 2019-06-28 DIAGNOSIS — E039 Hypothyroidism, unspecified: Secondary | ICD-10-CM

## 2019-06-28 DIAGNOSIS — O99282 Endocrine, nutritional and metabolic diseases complicating pregnancy, second trimester: Secondary | ICD-10-CM

## 2019-06-28 DIAGNOSIS — M329 Systemic lupus erythematosus, unspecified: Secondary | ICD-10-CM

## 2019-06-28 DIAGNOSIS — O99891 Other specified diseases and conditions complicating pregnancy: Secondary | ICD-10-CM

## 2019-06-28 NOTE — Progress Notes (Signed)
TELEHEALTH OBSTETRICS PRENATAL VIRTUAL VIDEO VISIT ENCOUNTER NOTE  Provider location: [facility] for [facility] [place]   I connected with Melissa Burch on 06/28/19 at  9:30 AM EDT by MyChart Video Encounter at home and verified that I am speaking with the correct person using two identifiers.   I discussed the limitations, risks, security and privacy concerns of performing an evaluation and management service virtually and the availability of in person appointments. I also discussed with the patient that there may be a patient responsible charge related to this service. The patient expressed understanding and agreed to proceed. Subjective:  Melissa Burch is a 33 y.o. [phone] at [redacted]w[redacted]d being seen today for ongoing prenatal care.  She is currently monitored for the following issues for this low-risk pregnancy and has Systemic lupus complicating pregnancy (HCC); Hypothyroid in pregnancy, antepartum, second trimester; Pure hypercholesterolemia; IBS (irritable bowel syndrome); Supervision of other normal pregnancy, antepartum; and [redacted] weeks gestation of pregnancy on their problem list.  Patient reports no complaints.  Contractions: Not present. Vag. Bleeding: None.  Movement: Present. Denies any leaking of fluid.   The following portions of the patient's history were reviewed and updated as appropriate: allergies, current medications, past family history, past medical history, past social history, past surgical history and problem list.   Objective:  There were no vitals filed for this visit.  Fetal Status:     Movement: Present     General:  Alert, oriented and cooperative. Patient is in no acute distress.  Respiratory: Normal respiratory effort, no problems with respiration noted  Mental Status: Normal mood and affect. Normal behavior. Normal judgment and thought content.  Rest of physical exam deferred due to type of encounter  Imaging: US Ob Detail + 14 Wk  Result Date:  06/19/2019 ----------------------------------------------------------------------  OBSTETRICS REPORT                       (Signed Final 06/19/2019 02:34 pm) ---------------------------------------------------------------------- Patient Info  ID #:       [id number]                          D.O.B.:  07-Jan-1986 (33 yrs)  Name:       Melissa Burch                 Visit Date: 06/19/2019 01:01 pm ---------------------------------------------------------------------- Performed By  Performed By:     Lenise Arena        Ref. Address:     [address]                    RDMS                                                             Rd                                                             Jacky Kindle  Attending:        Ma Rings MD  [facility]  Referred By:      Wilmer Floor LEFTWICH-                    KIRBY CNM ---------------------------------------------------------------------- Orders   #  Description                          Code         Ordered By   1  US OB DETAIL + 14 WK                 76811.0      LISA LEFTWICH-                                                        KIRBY  ----------------------------------------------------------------------   #  Order #                    Accession #                 Episode #   1  [id number]                  [id number]                  [id number]  ---------------------------------------------------------------------- Indications   Systemic lupus complicating pregnancy,         O26.892, M32.9   second trimester   Encounter for antenatal screening for          Z36.3   malformations (low rick Panorama)   Hypothyroid                                    O99.280 E03.9   Fetal abnormality - other known or             O35.9XX0   suspected (EIF-LV)   [redacted] weeks gestation of pregnancy                Z3A.19  ----------------------------------------------------------------------  Vital Signs  Weight (lb): 156                               Height:        5'7"  BMI:         24.43 ---------------------------------------------------------------------- Fetal Evaluation  Num Of Fetuses:         1  Fetal Heart Rate(bpm):  157  Cardiac Activity:       Observed  Presentation:           Cephalic  Placenta:               Anterior  P. Cord Insertion:      Visualized, central  Amniotic Fluid  AFI FV:  Within normal limits                              Largest Pocket(cm)                              6.03 ---------------------------------------------------------------------- Biometry  BPD:      43.8  mm     G. Age:  19w 2d         55  %    CI:        75.04   %    70 - 86                                                          FL/HC:      18.3   %    16.1 - 18.3  HC:      160.4  mm     G. Age:  18w 6d         29  %    HC/AC:      1.14        1.09 - 1.39  AC:      140.7  mm     G. Age:  19w 3d         56  %    FL/BPD:     66.9   %  FL:       29.3  mm     G. Age:  19w 0d         38  %    FL/AC:      20.8   %    20 - 24  CER:      20.3  mm     G. Age:  19w 2d         53  %  NFT:       3.6  mm  LV:        5.8  mm  CM:        3.6  mm  Est. FW:     280  gm    0 lb 10 oz      50  % ---------------------------------------------------------------------- OB History  Gravidity:    4         Term:   2         SAB:   1  Living:       2 ---------------------------------------------------------------------- Gestational Age  LMP:           19w 1d        Date:  02/05/19                 EDD:   11/12/19  U/S Today:     19w 1d                                        EDD:   11/12/19  Best:          19w 1d     Det. By:  LMP  (02/05/19)          EDD:   11/12/19 ----------------------------------------------------------------------  Anatomy  Cranium:               Appears normal         Aortic Arch:            Appears normal  Cavum:                 Appears normal         Ductal Arch:            Appears normal  Ventricles:             Appears normal         Diaphragm:              Appears normal  Choroid Plexus:        Appears normal         Stomach:                Appears normal, left                                                                        sided  Cerebellum:            Appears normal         Abdomen:                Appears normal  Posterior Fossa:       Appears normal         Abdominal Wall:         Appears nml (cord                                                                        insert, abd wall)  Nuchal Fold:           Appears normal         Cord Vessels:           Appears normal (3                                                                        vessel cord)  Face:                  Appears normal         Kidneys:                Appear normal                         (orbits and profile)  Lips:                  Appears normal         Bladder:  Appears normal  Thoracic:              Appears normal         Spine:                  Appears normal  Heart:                 Echogenic focus        Upper Extremities:      Appears normal                         in LV  RVOT:                  Appears normal         Lower Extremities:      Appears normal  LVOT:                  Appears normal  Other:  Parents do not wish to know sex of fetus. Nasal bone visualized.          Heels and 5th digit visualized. ---------------------------------------------------------------------- Cervix Uterus Adnexa  Cervix  Length:           3.01  cm.  Normal appearance by transabdominal scan.  Uterus  No abnormality visualized.  Left Ovary  Within normal limits.  Right Ovary  Within normal limits.  Cul De Sac  No free fluid seen.  Adnexa  No abnormality visualized. ---------------------------------------------------------------------- Comments  This patient was seen for a detailed fetal anatomy scan due  to a history of lupus that is currently treated with Plaquenil.  The patient has screened negative for the SSA and SSB   antibodies. She denies any other significant past medical  history and denies any problems in her current pregnancy.  She had a cell free DNA test earlier in her pregnancy which  indicated a low risk for trisomy 4, 74, and 13.  The patient  did not want the fetal gender revealed today.  She was informed that the fetal growth and amniotic fluid  level were appropriate for her gestational age.  On today's exam, an intracardiac echogenic focus was noted  in the left ventricle of the fetal heart.  The small association  between an echogenic focus and Down syndrome was  discussed. Due to the echogenic focus noted today, the  patient was offered and declined an amniocentesis today for  definitive diagnosis of fetal aneuploidy.  She reports that she  is comfortable with her negative cell free DNA test.  The patient was informed that anomalies may be missed due  to technical limitations. If the fetus is in a suboptimal position  or maternal habitus is increased, visualization of the fetus in  the maternal uterus may be impaired.  The increased risk of fetal growth issues and the  development of preeclampsia later in her pregnancy due to  lupus was discussed.  Due to her history of lupus, we will  continue to follow her with serial growth ultrasounds.  A follow-up exam was scheduled in 4 weeks. ----------------------------------------------------------------------                   Ma Rings, MD Electronically Signed Final Report   06/19/2019 02:34 pm ----------------------------------------------------------------------   Assessment and Plan:  Pregnancy: [id number] at [redacted]w[redacted]d - doing well - virtual visit in 4 weeks - She was offered to come in prn to get flu vaccine or  she can get it at her in person 28 week visit.  Preterm labor symptoms and general obstetric precautions including but not limited to vaginal bleeding, contractions, leaking of fluid and fetal movement were reviewed in detail with the patient. I  discussed the assessment and treatment plan with the patient. The patient was provided an opportunity to ask questions and all were answered. The patient agreed with the plan and demonstrated an understanding of the instructions. The patient was advised to call back or seek an in-person office evaluation/go to [facility]U at [facility] for any urgent or concerning symptoms. Please refer to After Visit Summary for other counseling recommendations.   I provided 10 minutes of face-to-face time during this encounter.  No follow-ups on file.  Future Appointments  Date Time Provider [place]  06/28/2019  9:30 AM Emily Filbert, MD [facility]  07/17/2019 11:30 AM [place] 1 [facility]  07/17/2019 11:35 AM [place], MD [facility], [place]

## 2019-06-28 NOTE — Telephone Encounter (Signed)
Left patient a message to call about appointment on 06/28/19. Can be moved to a virtual instead of in office.

## 2019-06-28 NOTE — Progress Notes (Signed)
Pt will take BP later today and put it into babyscripts

## 2019-07-17 ENCOUNTER — Ambulatory Visit (HOSPITAL_COMMUNITY): Payer: 59

## 2019-07-17 ENCOUNTER — Ambulatory Visit (HOSPITAL_COMMUNITY)
Admission: RE | Admit: 2019-07-17 | Discharge: 2019-07-17 | Disposition: A | Discharge status: Home / Self Care | Payer: 59 | Source: Ambulatory Visit | Attending: Obstetrics and Gynecology | Admitting: Obstetrics and Gynecology

## 2019-07-26 ENCOUNTER — Telehealth: Payer: 59 | Admitting: Obstetrics & Gynecology

## 2019-08-01 ENCOUNTER — Ambulatory Visit (HOSPITAL_COMMUNITY): Payer: 59

## 2019-08-04 ENCOUNTER — Telehealth (INDEPENDENT_AMBULATORY_CARE_PROVIDER_SITE_OTHER): Payer: 59 | Admitting: Obstetrics & Gynecology

## 2019-08-04 VITALS — BP 106/60 | Wt 160.0 lb

## 2019-08-04 DIAGNOSIS — Z8759 Personal history of other complications of pregnancy, childbirth and the puerperium: Secondary | ICD-10-CM | POA: Insufficient documentation

## 2019-08-04 DIAGNOSIS — Z3480 Encounter for supervision of other normal pregnancy, unspecified trimester: Secondary | ICD-10-CM

## 2019-08-04 DIAGNOSIS — O99282 Endocrine, nutritional and metabolic diseases complicating pregnancy, second trimester: Secondary | ICD-10-CM

## 2019-08-04 DIAGNOSIS — E7800 Pure hypercholesterolemia, unspecified: Secondary | ICD-10-CM | POA: Diagnosis not present

## 2019-08-04 DIAGNOSIS — D6862 Lupus anticoagulant syndrome: Secondary | ICD-10-CM | POA: Diagnosis not present

## 2019-08-04 DIAGNOSIS — E039 Hypothyroidism, unspecified: Secondary | ICD-10-CM

## 2019-08-04 DIAGNOSIS — O09892 Supervision of other high risk pregnancies, second trimester: Secondary | ICD-10-CM

## 2019-08-04 DIAGNOSIS — Z3A25 25 weeks gestation of pregnancy: Secondary | ICD-10-CM

## 2019-08-04 DIAGNOSIS — O2340 Unspecified infection of urinary tract in pregnancy, unspecified trimester: Secondary | ICD-10-CM

## 2019-08-04 DIAGNOSIS — M329 Systemic lupus erythematosus, unspecified: Secondary | ICD-10-CM

## 2019-08-04 DIAGNOSIS — B951 Streptococcus, group B, as the cause of diseases classified elsewhere: Secondary | ICD-10-CM

## 2019-08-04 DIAGNOSIS — O99891 Other specified diseases and conditions complicating pregnancy: Secondary | ICD-10-CM

## 2019-08-04 NOTE — Progress Notes (Signed)
   TELEHEALTH OBSTETRICS PRENATAL VIRTUAL VIDEO VISIT ENCOUNTER NOTE  Provider location: [facility] for [facility] at [place]   I connected with Ruben Im on 08/04/19 at 10:15 AM EST by MyChart Video Encounter at home and verified that I am speaking with the correct person using two identifiers.   I discussed the limitations, risks, security and privacy concerns of performing an evaluation and management service virtually and the availability of in person appointments. I also discussed with the patient that there may be a patient responsible charge related to this service. The patient expressed understanding and agreed to proceed. Subjective:  Melissa Burch is a 33 y.o. [phone] at [redacted]w[redacted]d being seen today for ongoing prenatal care.  She is currently monitored for the following issues for this high-risk pregnancy and has Systemic lupus complicating pregnancy ([place]); Hypothyroid in pregnancy, antepartum, second trimester; Pure hypercholesterolemia; IBS (irritable bowel syndrome); Supervision of other normal pregnancy, antepartum; and [redacted] weeks gestation of pregnancy on their problem list.  Patient reports no complaints.   .  .   . Denies any leaking of fluid.   The following portions of the patient's history were reviewed and updated as appropriate: allergies, current medications, past family history, past medical history, past social history, past surgical history and problem list.   Objective:   Vitals:   08/04/19 0851  BP: 106/60  Weight: 160 lb (72.6 kg)    Fetal Status:           General:  Alert, oriented and cooperative. Patient is in no acute distress.  Respiratory: Normal respiratory effort, no problems with respiration noted  Mental Status: Normal mood and affect. Normal behavior. Normal judgment and thought content.  Rest of physical exam deferred due to type of encounter  Imaging: No results found.  Assessment and Plan:  Pregnancy: [id number] at [redacted]w[redacted]d 1.  Supervision of other high risk pregnancy, antepartum   2. Systemic lupus complicating pregnancy ([place]) - taking plaquenil and baby asa  3. Hypothyroid in pregnancy, antepartum, second trimester - TSH q trimester  4. Group B Streptococcus urinary tract infection affecting pregnancy, antepartum - treat in labor    Preterm labor symptoms and general obstetric precautions including but not limited to vaginal bleeding, contractions, leaking of fluid and fetal movement were reviewed in detail with the patient. I discussed the assessment and treatment plan with the patient. The patient was provided an opportunity to ask questions and all were answered. The patient agreed with the plan and demonstrated an understanding of the instructions. The patient was advised to call back or seek an in-person office evaluation/go to MAU at [facility] for any urgent or concerning symptoms. Please refer to After Visit Summary for other counseling recommendations.   I provided 10 minutes of face-to-face time during this encounter.  No follow-ups on file.  Future Appointments  Date Time Provider [place]  08/09/2019  9:15 AM [place] NURSE [facility]  08/09/2019  9:15 AM [place] 4 WH-[facility]  08/23/2019  8:15 AM Sloan Leiter, MD [place], [place] for [facility], [place]

## 2019-08-09 ENCOUNTER — Other Ambulatory Visit (HOSPITAL_COMMUNITY): Payer: Self-pay | Admitting: *Deleted

## 2019-08-09 ENCOUNTER — Ambulatory Visit (HOSPITAL_COMMUNITY)
Admission: RE | Admit: 2019-08-09 | Discharge: 2019-08-09 | Disposition: A | Discharge status: Home / Self Care | Payer: 59 | Source: Ambulatory Visit | Attending: Obstetrics | Admitting: Obstetrics

## 2019-08-09 ENCOUNTER — Other Ambulatory Visit: Payer: Self-pay

## 2019-08-09 ENCOUNTER — Ambulatory Visit (HOSPITAL_COMMUNITY): Payer: 59 | Admitting: *Deleted

## 2019-08-09 ENCOUNTER — Encounter (HOSPITAL_COMMUNITY): Payer: Self-pay

## 2019-08-09 DIAGNOSIS — Z8759 Personal history of other complications of pregnancy, childbirth and the puerperium: Secondary | ICD-10-CM | POA: Insufficient documentation

## 2019-08-09 DIAGNOSIS — Z3480 Encounter for supervision of other normal pregnancy, unspecified trimester: Secondary | ICD-10-CM | POA: Insufficient documentation

## 2019-08-09 DIAGNOSIS — O359XX0 Maternal care for (suspected) fetal abnormality and damage, unspecified, not applicable or unspecified: Secondary | ICD-10-CM

## 2019-08-09 DIAGNOSIS — M329 Systemic lupus erythematosus, unspecified: Secondary | ICD-10-CM | POA: Insufficient documentation

## 2019-08-09 DIAGNOSIS — O26892 Other specified pregnancy related conditions, second trimester: Secondary | ICD-10-CM

## 2019-08-09 DIAGNOSIS — Z362 Encounter for other antenatal screening follow-up: Secondary | ICD-10-CM

## 2019-08-09 DIAGNOSIS — D6862 Lupus anticoagulant syndrome: Secondary | ICD-10-CM

## 2019-08-09 DIAGNOSIS — E039 Hypothyroidism, unspecified: Secondary | ICD-10-CM

## 2019-08-09 DIAGNOSIS — O99282 Endocrine, nutritional and metabolic diseases complicating pregnancy, second trimester: Secondary | ICD-10-CM

## 2019-08-09 DIAGNOSIS — Z3A26 26 weeks gestation of pregnancy: Secondary | ICD-10-CM

## 2019-08-23 ENCOUNTER — Encounter: Payer: 59 | Admitting: Obstetrics and Gynecology

## 2019-09-08 ENCOUNTER — Ambulatory Visit (HOSPITAL_COMMUNITY): Payer: 59

## 2019-09-08 ENCOUNTER — Encounter (HOSPITAL_COMMUNITY): Payer: Self-pay

## 2019-10-16 ENCOUNTER — Other Ambulatory Visit: Payer: Self-pay | Admitting: Physician Assistant

## 2019-10-20 ENCOUNTER — Other Ambulatory Visit: Payer: Self-pay | Admitting: Physician Assistant
